# Patient Record
Sex: Female | Born: 1965
Health system: Southern US, Community
[De-identification: ages and names within clinical notes are randomized; demographics above are authoritative.]

## PROBLEM LIST (undated history)

## (undated) DIAGNOSIS — F32A Depression, unspecified: Secondary | ICD-10-CM

## (undated) DIAGNOSIS — K219 Gastro-esophageal reflux disease without esophagitis: Secondary | ICD-10-CM

## (undated) DIAGNOSIS — F329 Major depressive disorder, single episode, unspecified: Secondary | ICD-10-CM

## (undated) DIAGNOSIS — M1990 Unspecified osteoarthritis, unspecified site: Secondary | ICD-10-CM

## (undated) DIAGNOSIS — T7840XA Allergy, unspecified, initial encounter: Secondary | ICD-10-CM

## (undated) DIAGNOSIS — N390 Urinary tract infection, site not specified: Secondary | ICD-10-CM

## (undated) HISTORY — DX: Urinary tract infection, site not specified: N39.0

## (undated) HISTORY — DX: Depression, unspecified: F32.A

## (undated) HISTORY — DX: Allergy, unspecified, initial encounter: T78.40XA

## (undated) HISTORY — DX: Gastro-esophageal reflux disease without esophagitis: K21.9

## (undated) HISTORY — DX: Major depressive disorder, single episode, unspecified: F32.9

---

## 1991-12-28 HISTORY — PX: TUBAL LIGATION: SHX77

## 2006-05-09 ENCOUNTER — Ambulatory Visit: Payer: Self-pay

## 2007-06-06 ENCOUNTER — Ambulatory Visit: Payer: Self-pay

## 2008-06-20 ENCOUNTER — Ambulatory Visit: Payer: Self-pay

## 2009-04-11 ENCOUNTER — Ambulatory Visit: Payer: Self-pay | Admitting: Internal Medicine

## 2009-04-14 ENCOUNTER — Ambulatory Visit: Payer: Self-pay | Admitting: Internal Medicine

## 2009-08-20 ENCOUNTER — Ambulatory Visit: Payer: Self-pay | Admitting: Internal Medicine

## 2010-10-07 ENCOUNTER — Ambulatory Visit: Payer: Self-pay | Admitting: Internal Medicine

## 2011-01-22 LAB — HM PAP SMEAR: HM Pap smear: NORMAL

## 2011-11-15 ENCOUNTER — Ambulatory Visit: Payer: Self-pay | Admitting: Internal Medicine

## 2012-11-29 ENCOUNTER — Ambulatory Visit: Payer: Self-pay | Admitting: Internal Medicine

## 2012-11-30 ENCOUNTER — Telehealth: Payer: Self-pay | Admitting: Internal Medicine

## 2012-11-30 NOTE — Telephone Encounter (Signed)
Spoke to patient, she has appt already scheduled with you  on 01/22/13.

## 2012-11-30 NOTE — Telephone Encounter (Signed)
This patient has not been seen by me but her mammogram report was sent to me.  Please ask her to make appt for exam since there was an bnormality on left breast.

## 2012-12-19 ENCOUNTER — Encounter: Payer: Self-pay | Admitting: Internal Medicine

## 2013-01-03 ENCOUNTER — Encounter: Payer: Self-pay | Admitting: Internal Medicine

## 2013-01-22 ENCOUNTER — Ambulatory Visit (INDEPENDENT_AMBULATORY_CARE_PROVIDER_SITE_OTHER): Payer: 59 | Admitting: Internal Medicine

## 2013-01-22 ENCOUNTER — Encounter: Payer: Self-pay | Admitting: Internal Medicine

## 2013-01-22 VITALS — BP 110/78 | HR 67 | Temp 98.0°F | Resp 16 | Ht 66.75 in | Wt 153.5 lb

## 2013-01-22 DIAGNOSIS — F329 Major depressive disorder, single episode, unspecified: Secondary | ICD-10-CM

## 2013-01-22 DIAGNOSIS — F341 Dysthymic disorder: Secondary | ICD-10-CM

## 2013-01-22 DIAGNOSIS — F419 Anxiety disorder, unspecified: Secondary | ICD-10-CM

## 2013-01-22 DIAGNOSIS — R928 Other abnormal and inconclusive findings on diagnostic imaging of breast: Secondary | ICD-10-CM

## 2013-01-22 DIAGNOSIS — R635 Abnormal weight gain: Secondary | ICD-10-CM

## 2013-01-22 MED ORDER — CITALOPRAM HYDROBROMIDE 10 MG PO TABS: 10.0000 mg | ORAL_TABLET | Freq: Every day | ORAL | Status: DC

## 2013-01-22 MED ORDER — OMEPRAZOLE 10 MG PO CPDR: 20.0000 mg | DELAYED_RELEASE_CAPSULE | Freq: Every day | ORAL | Status: DC

## 2013-01-22 NOTE — Progress Notes (Signed)
Patient ID: Jessica Thornton, female   DOB: 16-Feb-1966, 48 y.o.   MRN: [id number]     Patient Active Problem List  Diagnosis  . Abnormal mammogram  . Anxiety and depression  . Weight gain    Subjective:  CC:   Chief Complaint  Patient presents with  . Establish Care    HPI:   Jessica Thornton is a 47 y.o. female who presents as a new patient to establish primary care with the chief complaint of 1) History of anxiety ,  previously taking citalopram but has been off of it for the past year or so.  She was last seen 2 yrs ago and feels she needs it again. Marland Kitchen  2) history of chest pain due to esophagitis managed with omeprazole which she takes correctly in the morning.  3) Abnormal right mammogam Dec 4th, felt a pop in her breast during the diagnostic ultrasound.  Dr. [place] has recommended to repeat due in March    3) Weight gain of 20 lbs .  NOt exercising,  Not following a specific diet.  No constipation, hair loss or heat/cold intolerance. She works as a Engineer, structural at [employer]  And is going to night school to get her Masters in Electronic Data Systems at [employer] in [place].  Two nights per week .     Past Medical History  Diagnosis Date  . Depression   . GERD (gastroesophageal reflux disease)   . Allergy   . UTI (urinary tract infection)     Past Surgical History  Procedure Date  . Tubal ligation 1993    Family History  Problem Relation Age of Onset  . Alcohol abuse Father   . Arthritis Father   . Hyperlipidemia Father   . Stroke Father   . Hypertension Father   . Hyperlipidemia Maternal Grandfather   . Hypertension Maternal Grandfather   . Diabetes Maternal Grandfather   . Arthritis Paternal Grandmother   . Cancer Maternal Aunt     pancreatic ca    History   Social History  . Marital Status: Married    Spouse Name: N/A    Number of Children: N/A  . Years of Education: N/A   Occupational History  . Not on file.    Social History Main Topics  . Smoking status: Former Smoker    Quit date: 01/22/1993  . Smokeless tobacco: Not on file  . Alcohol Use: 2.4 oz/week    4 Glasses of wine per week  . Drug Use: No  . Sexually Active: Yes    Birth Control/ Protection: Surgical     Comment: tubal ligation    Other Topics Concern  . Not on file   Social History Narrative  . No narrative on file         @ALLHX @    Review of Systems:   The remainder of the review of systems was negative except those addressed in the HPI.       Objective:  BP 110/78  Pulse 67  Temp 98 F (36.7 C) (Oral)  Resp 16  Ht 5' 6.75" (1.695 m)  Wt 153 lb 8 oz (69.627 kg)  BMI 24.22 kg/m2  SpO2 98%  LMP 01/17/2013  General appearance: alert, cooperative and appears stated age Ears: normal TM's and external ear canals both ears Throat: lips, mucosa, and tongue normal; teeth and gums normal Neck: no adenopathy, no carotid bruit, supple, symmetrical, trachea midline and thyroid not enlarged, symmetric, no  tenderness/mass/nodules Back: symmetric, no curvature. ROM normal. No CVA tenderness. Lungs: clear to auscultation bilaterally Heart: regular rate and rhythm, S1, S2 normal, no murmur, click, rub or gallop Abdomen: soft, non-tender; bowel sounds normal; no masses,  no organomegaly Pulses: 2+ and symmetric Skin: Skin color, texture, turgor normal. No rashes or lesions Lymph nodes: Cervical, supraclavicular, and axillary nodes normal.  Assessment and Plan:  Abnormal mammogram Repeat diagnostic has been ordered for march . No palpable breast lesions currently.   Anxiety and depression Life and professional stressors noted,,  Advised to start exercisign for stress relief and resume citalopram at 10 mg daily ,.  Weight gain I have addressed  BMI and recommended a low glycemic index diet utilizing smaller more frequent meals to increase metabolism.  I have also recommended that patient start exercising  with a goal of 30 minutes of aerobic exercise a minimum of 5 days per week. Screening for lipid disorders, thyroid and diabetes to be done today.     Updated Medication List Outpatient Encounter Prescriptions as of 01/22/2013  Medication Sig Dispense Refill  . CALCIUM-MAGNESIUM-ZINC PO Take 1 capsule by mouth daily.      . Multiple Vitamin (MULTIVITAMIN) tablet Take 1 tablet by mouth daily.      Marland Kitchen omeprazole (PRILOSEC) 10 MG capsule Take 2 capsules (20 mg total) by mouth daily.  30 capsule  6  . [DISCONTINUED] omeprazole (PRILOSEC) 10 MG capsule Take 10 mg by mouth daily.      . citalopram (CELEXA) 10 MG tablet Take 1 tablet (10 mg total) by mouth daily.  30 tablet  3     Orders Placed This Encounter  Procedures  . MM Digital Diagnostic Unilat R  . HM PAP SMEAR    No Follow-up on file.

## 2013-01-22 NOTE — Patient Instructions (Addendum)
You can lose 10%  Of your current body weight over the next 3 months   Return at your leisure for fasting labs   This is  my version of a  "Low GI"  Diet:  It is not ultra low carb, but will still lower your blood sugars and allow you to lose 5 to 10 lbs per month if you follow it carefully. All of the foods can be found at grocery stores and in bulk at [employer].  The Atkins protein bars and shakes are available in more varieties at Target, [employer] and [employer].     7 AM Breakfast:  Low carbohydrate Protein  Shakes (I recommend the EAS AdvantEdge "Carb Control" shakes  Or the low carb shakes by Atkins.   Both are available everywhere:  In  cases at [employer]Js  Or in 4 packs at grocery stores and pharmacies  2.5 carbs  (Alternative is  a toasted Arnold's Sandwhich Thin w/ peanut butter, a "Bagel Thin" with cream cheese and salmon) or  a scrambled egg burrito made with a low carb tortilla .  Avoid cereal and bananas, oatmeal too unless you are cooking the old fashioned kind that takes 30-40 minutes to prepare.  the rest is overly processed, has minimal fiber, and is loaded with carbohydrates!   10 AM: Protein bar by Atkins (the snack size, under 200 cal).  There are many varieties , available widely again or in bulk in limited varieties at BJs)  Other so called "protein bars" tend to be loaded with carbohydrates.  Remember, in food advertising, the word "energy" is synonymous for " carbohydrate."  Lunch: sandwich of [place], (or any lunchmeat, grilled meat or canned tuna), fresh avocado, mayonnaise  and cheese on a lower carbohydrate pita bread, flatbread, or tortilla . Ok to use regular mayonnaise. The bread is the only source or carbohydrate that can be decreased (Joseph's makes a pita bread and a flat bread that are 50 cal and 4 net carbs ; Toufayan makes a low carb flatbread that's 100 cal and 9 net carbs  and  Mission makes a low carb whole wheat tortilla  That is 210 cal and 6 net carbs)  3 PM:  Mid  day :  Another protein bar,  Or a  cheese stick (100 cal, 0 carbs),  Or 1 ounce of  almonds, walnuts, pistachios, pecans, peanuts,  Macadamia nuts. Or a Dannon light n Fit greek yogurt, 80 cal 8 net carbs . Avoid "granola"; the dried cranberries and raisins are loaded with carbohydrates. Mixed nuts ok if no raisins or cranberries or dried fruit.      6 PM  Dinner:  "mean and green:"  Meat/chicken/fish or a high protein legume; , with a green salad, and a low GI  Veggie (broccoli, cauliflower, green beans, spinach, brussel sprouts. Lima beans) : Avoid "Low fat dressings, as well as Reyne Dumas and [address]! They are loaded with sugar! Instead use ranch, vinagrette,  Blue cheese, etc.  There is a low carb pasta by Dreamfield's available at [facility] that is acceptable and tastes great. Try Michel Angel's chicken piccata over low carb pasta. The chicken dish is 0 carbs, and can be found in frozen section at BJs and Lowe's. Also try [employer]" (pulled pork, no sauce,  0 carbs) and his pot roast.   both are in the refrigerated section at BJs   Dreamfield's makes a low carb pasta only 5 g/serving.  Available at  all grocery stores,  And tastes like normal pasta  9 PM snack : Breyer's "low carb" fudgsicle or  ice cream bar (Carb Smart line), or  Weight Watcher's ice cream bar , or another "no sugar added" ice cream;a serving of fresh berries/cherries with whipped cream (Avoid bananas, pineapple, grapes  and watermelon on a regular basis because they are high in sugar)   Remember that snack Substitutions should be less than 10 carbs per serving and meals < 20 carbs. Remember to subtract fiber grams and sugar alcohols to get the "net carbs."

## 2013-01-23 DIAGNOSIS — F419 Anxiety disorder, unspecified: Secondary | ICD-10-CM | POA: Insufficient documentation

## 2013-01-23 DIAGNOSIS — R635 Abnormal weight gain: Secondary | ICD-10-CM | POA: Insufficient documentation

## 2013-01-23 DIAGNOSIS — R928 Other abnormal and inconclusive findings on diagnostic imaging of breast: Secondary | ICD-10-CM | POA: Insufficient documentation

## 2013-01-23 NOTE — Assessment & Plan Note (Signed)
Life and professional stressors noted,,  Advised to start exercisign for stress relief and resume citalopram at 10 mg daily ,.

## 2013-01-23 NOTE — Assessment & Plan Note (Addendum)
Repeat diagnostic has been ordered for march . No palpable breast lesions currently.

## 2013-01-23 NOTE — Assessment & Plan Note (Signed)
I have addressed  BMI and recommended a low glycemic index diet utilizing smaller more frequent meals to increase metabolism.  I have also recommended that patient start exercising with a goal of 30 minutes of aerobic exercise a minimum of 5 days per week. Screening for lipid disorders, thyroid and diabetes to be done today.   

## 2013-02-01 ENCOUNTER — Other Ambulatory Visit: Payer: 59

## 2013-02-21 ENCOUNTER — Other Ambulatory Visit: Payer: 59

## 2013-03-14 ENCOUNTER — Telehealth: Payer: Self-pay | Admitting: *Deleted

## 2013-03-14 DIAGNOSIS — Z13220 Encounter for screening for lipoid disorders: Secondary | ICD-10-CM

## 2013-03-14 DIAGNOSIS — R5381 Other malaise: Secondary | ICD-10-CM

## 2013-03-14 NOTE — Telephone Encounter (Signed)
Pt is coming in for labs tomorrow 03.20.2014 what labs and dx would you like? Thank you

## 2013-03-15 ENCOUNTER — Other Ambulatory Visit (INDEPENDENT_AMBULATORY_CARE_PROVIDER_SITE_OTHER): Payer: 59

## 2013-03-15 ENCOUNTER — Other Ambulatory Visit: Payer: Self-pay | Admitting: *Deleted

## 2013-03-15 DIAGNOSIS — R5381 Other malaise: Secondary | ICD-10-CM

## 2013-03-15 DIAGNOSIS — R5383 Other fatigue: Secondary | ICD-10-CM

## 2013-03-15 DIAGNOSIS — Z13220 Encounter for screening for lipoid disorders: Secondary | ICD-10-CM

## 2013-03-15 LAB — LIPID PANEL
Cholesterol: 181 mg/dL (ref 0–200)
HDL: 44.9 mg/dL (ref >39.00)
LDL Cholesterol: 119 mg/dL — ABNORMAL HIGH (ref 0–99)
Total CHOL/HDL Ratio: 4
Triglycerides: 87 mg/dL (ref 0.0–149.0)
VLDL: 17.4 mg/dL (ref 0.0–40.0)

## 2013-03-15 LAB — COMPREHENSIVE METABOLIC PANEL
ALT: 18 U/L (ref 0–35)
AST: 18 U/L (ref 0–37)
Albumin: 4.4 g/dL (ref 3.5–5.2)
Alkaline Phosphatase: 52 U/L (ref 39–117)
BUN: 9 mg/dL (ref 6–23)
CO2: 26 mEq/L (ref 19–32)
Calcium: 8.8 mg/dL (ref 8.4–10.5)
Chloride: 103 mEq/L (ref 96–112)
Creatinine, Ser: 0.6 mg/dL (ref 0.4–1.2)
GFR: 111.61 mL/min (ref >60.00)
Glucose, Bld: 90 mg/dL (ref 70–99)
Potassium: 4.2 mEq/L (ref 3.5–5.1)
Sodium: 136 mEq/L (ref 135–145)
Total Bilirubin: 1 mg/dL (ref 0.3–1.2)
Total Protein: 7.8 g/dL (ref 6.0–8.3)

## 2013-03-15 LAB — CBC WITH DIFFERENTIAL/PLATELET
Basophils Absolute: 0 10*3/uL (ref 0.0–0.1)
Basophils Relative: 0.4 % (ref 0.0–3.0)
Eosinophils Absolute: 0.2 10*3/uL (ref 0.0–0.7)
Eosinophils Relative: 2 % (ref 0.0–5.0)
HCT: 40.1 % (ref 36.0–46.0)
Hemoglobin: 13.4 g/dL (ref 12.0–15.0)
Lymphocytes Relative: 23.7 % (ref 12.0–46.0)
Lymphs Abs: 2 10*3/uL (ref 0.7–4.0)
MCHC: 33.3 g/dL (ref 30.0–36.0)
MCV: 98 fl (ref 78.0–100.0)
Monocytes Absolute: 0.8 10*3/uL (ref 0.1–1.0)
Monocytes Relative: 9.5 % (ref 3.0–12.0)
Neutro Abs: 5.5 10*3/uL (ref 1.4–7.7)
Neutrophils Relative %: 64.4 % (ref 43.0–77.0)
Platelets: 225 10*3/uL (ref 150.0–400.0)
RBC: 4.09 Mil/uL (ref 3.87–5.11)
RDW: 13.2 % (ref 11.5–14.6)
WBC: 8.6 10*3/uL (ref 4.5–10.5)

## 2013-03-15 LAB — TSH: TSH: 0.71 u[IU]/mL (ref 0.35–5.50)

## 2013-03-16 ENCOUNTER — Encounter: Payer: Self-pay | Admitting: General Practice

## 2013-04-18 LAB — HM PAP SMEAR: HM Pap smear: NORMAL

## 2013-04-24 ENCOUNTER — Encounter: Payer: Self-pay | Admitting: Internal Medicine

## 2013-04-24 ENCOUNTER — Other Ambulatory Visit (HOSPITAL_COMMUNITY)
Admission: RE | Admit: 2013-04-24 | Discharge: 2013-04-24 | Disposition: A | Discharge status: Home / Self Care | Payer: 59 | Source: Ambulatory Visit | Attending: Internal Medicine | Admitting: Internal Medicine

## 2013-04-24 ENCOUNTER — Ambulatory Visit (INDEPENDENT_AMBULATORY_CARE_PROVIDER_SITE_OTHER): Payer: 59 | Admitting: Internal Medicine

## 2013-04-24 VITALS — BP 118/78 | HR 90 | Temp 98.1°F | Resp 16 | Ht 67.0 in | Wt 144.8 lb

## 2013-04-24 DIAGNOSIS — R928 Other abnormal and inconclusive findings on diagnostic imaging of breast: Secondary | ICD-10-CM

## 2013-04-24 DIAGNOSIS — F341 Dysthymic disorder: Secondary | ICD-10-CM

## 2013-04-24 DIAGNOSIS — F419 Anxiety disorder, unspecified: Secondary | ICD-10-CM

## 2013-04-24 DIAGNOSIS — Z0000 Encounter for general adult medical examination without abnormal findings: Secondary | ICD-10-CM

## 2013-04-24 DIAGNOSIS — Z124 Encounter for screening for malignant neoplasm of cervix: Secondary | ICD-10-CM

## 2013-04-24 DIAGNOSIS — Z01419 Encounter for gynecological examination (general) (routine) without abnormal findings: Secondary | ICD-10-CM | POA: Insufficient documentation

## 2013-04-24 DIAGNOSIS — Z1151 Encounter for screening for human papillomavirus (HPV): Secondary | ICD-10-CM | POA: Insufficient documentation

## 2013-04-24 MED ORDER — OMEPRAZOLE 20 MG PO CPDR: 20.0000 mg | DELAYED_RELEASE_CAPSULE | Freq: Every day | ORAL | Status: DC

## 2013-04-24 NOTE — Assessment & Plan Note (Signed)
She had additional mammograms sone in march which were normal

## 2013-04-24 NOTE — Assessment & Plan Note (Signed)
Annual comprehensive exam was done including breast, pelvic and PAP smear. All screenings have been addressed .  

## 2013-04-24 NOTE — Assessment & Plan Note (Signed)
Managed with citalopram . No changes today  

## 2013-04-24 NOTE — Progress Notes (Signed)
Patient ID: Jessica Thornton, female   DOB: [birth year], 47 y.o.   MRN: [id number]  Subjective:     Jessica Thornton is a 47 y.o. female and is here for a comprehensive physical exam. The patient reports no problems.  History   Social History  . Marital Status: Married    Spouse Name: N/A    Number of Children: N/A  . Years of Education: N/A   Occupational History  . Not on file.   Social History Main Topics  . Smoking status: Former Smoker    Quit date: 01/22/1993  . Smokeless tobacco: Not on file  . Alcohol Use: 2.4 oz/week    4 Glasses of wine per week  . Drug Use: No  . Sexually Active: Yes    Birth Control/ Protection: Surgical     Comment: tubal ligation    Other Topics Concern  . Not on file   Social History Narrative  . No narrative on file   Health Maintenance  Topic Date Due  . Tetanus/tdap  12/04/1984  . Influenza Vaccine  08/27/2013  . Pap Smear  01/22/2014    The following portions of the patient's history were reviewed and updated as appropriate: allergies, current medications, past family history, past medical history, past social history, past surgical history and problem list.  Review of Systems A comprehensive review of systems was negative.   Objective:   BP 118/78  Pulse 90  Temp(Src) 98.1 F (36.7 C) (Oral)  Resp 16  Ht 5\' 7"  (1.702 m)  Wt 144 lb 12 oz (65.658 kg)  BMI 22.67 kg/m2  SpO2 99%  LMP 04/20/2013   General Appearance:    Alert, cooperative, no distress, appears stated age  Head:    Normocephalic, without obvious abnormality, atraumatic  Eyes:    PERRL, conjunctiva/corneas clear, EOM's intact, fundi    benign, both eyes  Ears:    Normal TM's and external ear canals, both ears  Nose:   Nares normal, septum midline, mucosa normal, no drainage    or sinus tenderness  Throat:   Lips, mucosa, and tongue normal; teeth and gums normal  Neck:   Supple, symmetrical, trachea midline, no adenopathy;    thyroid:  no  enlargement/tenderness/nodules; no carotid   bruit or JVD  Back:     Symmetric, no curvature, ROM normal, no CVA tenderness  Lungs:     Clear to auscultation bilaterally, respirations unlabored  Chest Wall:    No tenderness or deformity   Heart:    Regular rate and rhythm, S1 and S2 normal, no murmur, rub   or gallop  Breast Exam:    No tenderness, masses, or nipple abnormality  Abdomen:     Soft, non-tender, bowel sounds active all four quadrants,    no masses, no organomegaly  Genitalia:    Pelvic: cervix normal in appearance, external genitalia normal, no adnexal masses or tenderness, no cervical motion tenderness, rectovaginal septum normal, uterus normal size, shape, and consistency and vagina normal without discharge  Extremities:   Extremities normal, atraumatic, no cyanosis or edema  Pulses:   2+ and symmetric all extremities  Skin:   Skin color, texture, turgor normal, no rashes or lesions  Lymph nodes:   Cervical, supraclavicular, and axillary nodes normal  Neurologic:   CNII-XII intact, normal strength, sensation and reflexes    throughout    .    Assessment:   Abnormal mammogram She had additional mammograms sone in march which were normal  Routine general  medical examination at a health care facility Annual comprehensive exam was done including breast, pelvic and PAP smear. All screenings have been addressed .   Anxiety and depression Managed with citalopram.  No changes today.   Updated Medication List Outpatient Encounter Prescriptions as of 04/24/2013  Medication Sig Dispense Refill  . CALCIUM-MAGNESIUM-ZINC PO Take 1 capsule by mouth daily.      . citalopram (CELEXA) 10 MG tablet Take 1 tablet (10 mg total) by mouth daily.  30 tablet  3  . Multiple Vitamin (MULTIVITAMIN) tablet Take 1 tablet by mouth daily.      Marland Kitchen omeprazole (PRILOSEC) 20 MG capsule Take 1 capsule (20 mg total) by mouth daily.  90 capsule  3  . [DISCONTINUED] omeprazole (PRILOSEC) 10 MG capsule  Take 2 capsules (20 mg total) by mouth daily.  30 capsule  6   No facility-administered encounter medications on file as of 04/24/2013.

## 2013-05-31 ENCOUNTER — Ambulatory Visit: Payer: Self-pay | Admitting: Internal Medicine

## 2013-07-13 ENCOUNTER — Encounter: Payer: Self-pay | Admitting: Internal Medicine

## 2013-10-12 ENCOUNTER — Other Ambulatory Visit: Payer: Self-pay | Admitting: Internal Medicine

## 2013-10-12 MED ORDER — CITALOPRAM HYDROBROMIDE 10 MG PO TABS: 10.0000 mg | ORAL_TABLET | Freq: Every day | ORAL | Status: DC

## 2013-10-12 NOTE — Telephone Encounter (Signed)
Rx sent 

## 2013-10-12 NOTE — Telephone Encounter (Signed)
citalopram (CELEXA) 10 MG tablet  #90

## 2014-01-03 ENCOUNTER — Other Ambulatory Visit: Payer: Self-pay | Admitting: Internal Medicine

## 2014-02-06 ENCOUNTER — Ambulatory Visit: Payer: Self-pay | Admitting: Internal Medicine

## 2014-02-19 LAB — HM MAMMOGRAPHY

## 2014-03-12 ENCOUNTER — Encounter: Payer: Self-pay | Admitting: Internal Medicine

## 2014-04-08 ENCOUNTER — Encounter: Payer: Self-pay | Admitting: Internal Medicine

## 2014-04-08 ENCOUNTER — Ambulatory Visit (INDEPENDENT_AMBULATORY_CARE_PROVIDER_SITE_OTHER): Payer: 59 | Admitting: Internal Medicine

## 2014-04-08 VITALS — BP 128/76 | HR 61 | Temp 98.5°F | Resp 16 | Wt 152.2 lb

## 2014-04-08 DIAGNOSIS — F341 Dysthymic disorder: Secondary | ICD-10-CM

## 2014-04-08 DIAGNOSIS — M7040 Prepatellar bursitis, unspecified knee: Secondary | ICD-10-CM

## 2014-04-08 DIAGNOSIS — F329 Major depressive disorder, single episode, unspecified: Secondary | ICD-10-CM

## 2014-04-08 DIAGNOSIS — R358 Other polyuria: Secondary | ICD-10-CM

## 2014-04-08 DIAGNOSIS — F419 Anxiety disorder, unspecified: Secondary | ICD-10-CM

## 2014-04-08 DIAGNOSIS — K9049 Malabsorption due to intolerance, not elsewhere classified: Secondary | ICD-10-CM

## 2014-04-08 DIAGNOSIS — K9089 Other intestinal malabsorption: Secondary | ICD-10-CM

## 2014-04-08 DIAGNOSIS — M7041 Prepatellar bursitis, right knee: Secondary | ICD-10-CM

## 2014-04-08 DIAGNOSIS — N3090 Cystitis, unspecified without hematuria: Secondary | ICD-10-CM

## 2014-04-08 DIAGNOSIS — F32A Depression, unspecified: Secondary | ICD-10-CM

## 2014-04-08 DIAGNOSIS — R3589 Other polyuria: Secondary | ICD-10-CM

## 2014-04-08 DIAGNOSIS — R635 Abnormal weight gain: Secondary | ICD-10-CM

## 2014-04-08 LAB — POCT URINALYSIS DIPSTICK
Bilirubin, UA: NEGATIVE
Blood, UA: NEGATIVE
Glucose, UA: NEGATIVE
Ketones, UA: NEGATIVE
Leukocytes, UA: NEGATIVE
Nitrite, UA: NEGATIVE
Protein, UA: NEGATIVE
Spec Grav, UA: 1.01
Urobilinogen, UA: 0.2
pH, UA: 7

## 2014-04-08 MED ORDER — ALPRAZOLAM 0.5 MG PO TABS: 0.5000 mg | ORAL_TABLET | Freq: Every evening | ORAL | Status: DC | PRN

## 2014-04-08 MED ORDER — MELOXICAM 15 MG PO TABS: 15.0000 mg | ORAL_TABLET | Freq: Every day | ORAL | Status: DC

## 2014-04-08 NOTE — Progress Notes (Signed)
Pre-visit discussion using our clinic review tool. No additional management support is needed unless otherwise documented below in the visit note.  

## 2014-04-08 NOTE — Progress Notes (Signed)
Patient ID: Jessica Thornton, female   DOB: 01/30/1966, 48 y.o.   MRN: [id number]  Patient Active Problem List   Diagnosis Date Noted  . 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 Prepatellar bursitis of right knee 04/08/2014  . Cystitis 04/08/2014  . Routine general medical examination at a health care facility 04/24/2013  . Abnormal mammogram 01/23/2013  . Anxiety and depression 01/23/2013  . Weight gain 01/23/2013    Subjective:  CC:   Chief Complaint  Patient presents with  . Urinary Tract Infection    [facility] friday diagnosed  . Knee Pain    bilateral  problems with gas on stomach last 2 weeks    HPI:   Jessica Thornton is a 48 y.o. female who presents for Follow up on several issues.  Last seen April 2014   10 days ago developed dysuria after pulling out a dry tampon .  Had some vaginal discomfort afterward For a weekend but then developed migratory abdominal  pain  Which would alternate between left and right sides accompanied by fatigue so after a week she went to [facility]  And was treated  for pyuria with ciprofloxacin.    Symptoms have resolved as of  2 days ago.  Attributes the abd pain to  gas since she had changed to high fiber diet, and had been having a lot of burping.  No indigestion or constipation or diarrhea.    2) Knee pain bilaterally accompendied by recent effusion on the right,  Symptoms of pain  aggravated by walking for exercise, progressed to the point that she had pain at work to walk or stand .  Couldn't kneel on either due to fluid .  Had unofficial u/s of right knee,  Effusion seen.    Past Medical History  Diagnosis Date  . Depression   . GERD (gastroesophageal reflux disease)   . Allergy   . UTI (urinary tract infection)     Past Surgical History  Procedure Laterality Date  . Tubal ligation  1993       The following portions of the patient's history were reviewed and updated as appropriate: Allergies,  current medications, and problem list.    Review of Systems:   Patient denies headache, fevers, malaise, unintentional weight loss, skin rash, eye pain, sinus congestion and sinus pain, sore throat, dysphagia,  hemoptysis , cough, dyspnea, wheezing, chest pain, palpitations, orthopnea, edema, abdominal pain, nausea, melena, diarrhea, constipation, flank pain, dysuria, hematuria, urinary  Frequency, nocturia, numbness, tingling, seizures,  Focal weakness, Loss of consciousness,  Tremor, insomnia, depression, anxiety, and suicidal ideation.     History   Social History  . Marital Status: Married    Spouse Name: N/A    Number of Children: N/A  . Years of Education: N/A   Occupational History  . Not on file.   Social History Main Topics  . Smoking status: Former Smoker    Quit date: 01/22/1993  . Smokeless tobacco: Not on file  . Alcohol Use: 2.4 oz/week    4 Glasses of wine per week  . Drug Use: No  . Sexual Activity: Yes    Birth Control/ Protection: Surgical     Comment: tubal ligation    Other Topics Concern  . Not on file   Social History Narrative  . No narrative on file    Objective:  Filed Vitals:   04/08/14 1336  BP: 128/76  Pulse: 61  Temp: 98.5 F (36.9 C)  Resp:  16     General appearance: alert, cooperative and appears stated age Ears: normal TM's and external ear canals both ears Throat: lips, mucosa, and tongue normal; teeth and gums normal Neck: no adenopathy, no carotid bruit, supple, symmetrical, trachea midline and thyroid not enlarged, symmetric, no tenderness/mass/nodules Back: symmetric, no curvature. ROM normal. No CVA tenderness. Lungs: clear to auscultation bilaterally Heart: regular rate and rhythm, S1, S2 normal, no murmur, click, rub or gallop Abdomen: soft, non-tender; bowel sounds normal; no masses,  no organomegaly Pulses: 2+ and symmetric Skin: Skin color, texture, turgor normal. No rashes or lesions Lymph nodes: Cervical,  supraclavicular, and axillary nodes normal. MSK: creiptus bilaterally, r > L.  No effusion   Assessment and Plan:  Anxiety and depression Managed with celexa 10 mg daily.  Took it for 3 months before tapering it off  6 weeks ago .  Wants to manage her anxiety without daily medication .  Prn alprazolam. Risk and benefits of medication were discussed with patient today  Prepatellar bursitis of right knee With crepitus noted on exam,  Effusion currently resolved but was confirmed with unofficial ultrasound done a month ago at work.  Recommended daily meloxicam, ice,  And quad/hamstring strengthening  exercises.  Referral to [facility] if no improvement   Cystitis Resolved by repeat UA, with empiric ciprofloxacin   Weight gain No signs of thyroid disorder,  BMI is normal  I have recommended  low glycemic index diet and regular exercise a minimum of 5 days per week.    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    A total of 40 minutes was spent with patient more than half of which was spent in counseling, reviewing records from other prviders and coordination of care.  Updated Medication List Outpatient Encounter Prescriptions as of 04/08/2014  Medication Sig  . CALCIUM-MAGNESIUM-ZINC PO Take 1 capsule by mouth daily.  . ciprofloxacin (CIPRO) 500 MG tablet Take 500 mg by mouth 2 (two) times daily.  . Multiple Vitamin (MULTIVITAMIN) tablet Take 1 tablet by mouth daily.  Marland Kitchen omeprazole (PRILOSEC) 20 MG capsule Take 1 capsule (20 mg total) by mouth daily.  Marland Kitchen ALPRAZolam (XANAX) 0.5 MG tablet Take 1 tablet (0.5 mg total) by mouth at bedtime as needed for anxiety.  . citalopram (CELEXA) 10 MG tablet Take 1 tablet (10 mg total) by mouth daily.  . meloxicam (MOBIC) 15 MG tablet Take 1 tablet (15 mg total) by mouth daily.     Orders Placed This Encounter  Procedures  . POCT Urinalysis Dipstick    Return in about 3 months (around  07/08/2014).

## 2014-04-08 NOTE — Patient Instructions (Signed)
I have prescribed alprazolam to use AS NEEDED for extreme anxiety instead of daily citalopram  Prepatellar Bursitis with Rehab  Bursitis is a condition that is characterized by inflammation of a bursa. Saunders Revel exists in many areas of the body. They are fluid filled sacs that lie between a soft tissue (skin, tendon, or ligament) and a bone, and they reduce friction between the structures as well as the stress placed on the soft tissue. Prepatellar bursitis is inflammation of the bursa that lies between the skin and the kneecap (patella). This condition often causes pain over the patella. SYMPTOMS   Pain, tenderness, and/or inflammation over the patella.  Pain that worsens with movement of the knee joint.  Decreased range of motion for the knee joint.  A crackling sound (crepitation) when the bursa is moved or touched.  Occasionally, painless swelling of the bursa.  Fever (when infected). CAUSES  Bursitis is caused by damage to the bursa, which results in an inflammatory response. Common mechanisms of injury include:  Direct trauma to the front of the knee.  Repetitive and/ or stressful use of the knee. RISK INCREASES WITH:  Activities in which kneeling and/or falling on one's knees is likely (volleyball or football).  Repetitive and stressful training, especially if it involves running on hills.  Improper training techniques, such as a sudden increase in the intensity, frequency or duration of training.  Failure to warm-up properly before activity.  Poor technique.  Artificial turf. PREVENTION   Avoid kneeling or falling on your knees.  Warm up and stretch properly before activity.  Allow for adequate recovery between workouts.  Maintain physical fitness:  Strength, flexibility, and endurance.  Cardiovascular fitness.  Learn and use proper technique. When possible, a have coach correct improper technique.  Wear properly fitted and padded protective equipment (knee  pads). PROGNOSIS  If treated properly, then the symptoms of prepatellar bursitis usually resolve within 2 weeks. RELATED COMPLICATIONS   Recurrent symptoms that result in a chronic problem.  Prolonged healing time, if improperly treated or re-injured.  Limited range of motion.  Infection of bursa.  Chronic inflammation or scarring of bursa. TREATMENT  Treatment initially involves the use of ice and medication to help reduce pain and inflammation. The use of strengthening and stretching exercises may help reduce pain with activity, especially those of the quadriceps and hamstring muscles. These exercises may be performed at home or with referral to a therapist. Your caregiver may recommend kneepads when you return to playing sports, in order to reduce the stress on the prepatellar bursa. If symptoms persist despite treatment, then your caregiver may drain fluid out with a needle (aspirate) the bursa. If symptoms persist for greater than 6 months despite non-surgical (conservative) treatment, then surgery may be recommended to remove the bursa.  MEDICATION  If pain medication is necessary, then nonsteroidal anti-inflammatory medications, such as aspirin and ibuprofen, or other minor pain relievers, such as acetaminophen, are often recommended.  Do not take pain medication for 7 days before surgery.  Prescription pain relievers may be given if deemed necessary by your caregiver. Use only as directed and only as much as you need.  Corticosteroid injections may be given by your caregiver. These injections should be reserved for the most serious cases, because they may only be given a certain number of times. HEAT AND COLD  Cold treatment (icing) relieves pain and reduces inflammation. Cold treatment should be applied for 10 to 15 minutes every 2 to 3 hours for inflammation and  pain and immediately after any activity that aggravates your symptoms. Use ice packs or massage the area with a piece  of ice (ice massage).  Heat treatment may be used prior to performing the stretching and strengthening activities prescribed by your caregiver, physical therapist, or athletic trainer. Use a heat pack or soak the injury in warm water. SEEK MEDICAL CARE IF:  Treatment seems to offer no benefit, or the condition worsens.  Any medications produce adverse side effects. EXERCISES RANGE OF MOTION (ROM) AND STRETCHING EXERCISES - Prepatellar Bursitis These exercises may help you when beginning to rehabilitate your injury. Your symptoms may resolve with or without further involvement from your physician, physical therapist or athletic trainer. While completing these exercises, remember:   Restoring tissue flexibility helps normal motion to return to the joints. This allows healthier, less painful movement and activity.  An effective stretch should be held for at least 30 seconds.  A stretch should never be painful. You should only feel a gentle lengthening or release in the stretched tissue. STRETCH - Hamstrings, Standing  Stand or sit and extend your right / left leg, placing your foot on a chair or foot stool  Keeping a slight arch in your low back and your hips straight forward.  Lead with your chest and lean forward at the waist until you feel a gentle stretch in the back of your right / left knee or thigh. (When done correctly, this exercise requires leaning only a small distance.)  Hold this position for __________ seconds. Repeat __________ times. Complete this stretch __________ times per day. STRETCH - Quadriceps, Prone   Lie on your stomach on a firm surface, such as a bed or padded floor.  Bend your right / left knee and grasp your ankle. If you are unable to reach, your ankle or pant leg, use a belt around your foot to lengthen your reach.  Gently pull your heel toward your buttocks. Your knee should not slide out to the side. You should feel a stretch in the front of your thigh  and/or knee.  Hold this position for __________ seconds. Repeat __________ times. Complete this stretch __________ times per day.  STRETCH - Hamstrings/Adductors, V-Sit   Sit on the floor with your legs extended in a large "V," keeping your knees straight.  With your head and chest upright, bend at your waist reaching for your right foot to stretch your left adductors.  You should feel a stretch in your left inner thigh. Hold for __________ seconds.  Return to the upright position to relax your leg muscles.  Continuing to keep your chest upright, bend straight forward at your waist to stretch your hamstrings.  You should feel a stretch behind both of your thighs and/or knees. Hold for __________ seconds.  Return to the upright position to relax your leg muscles.  Repeat steps 2 through 4. Repeat __________ times. Complete this exercise __________ times per day.  STRENGTHENING EXERCISES - Prepatellar Bursitis  These exercises may help you when beginning to rehabilitate your injury. They may resolve your symptoms with or without further involvement from your physician, physical therapist or athletic trainer. While completing these exercises, remember:  Muscles can gain both the endurance and the strength needed for everyday activities through controlled exercises.  Complete these exercises as instructed by your physician, physical therapist or athletic trainer. Progress the resistance and repetitions only as guided. STRENGTH - Quadriceps, Isometrics  Lie on your back with your right / left leg extended  and your opposite knee bent.  Gradually tense the muscles in the front of your right / left thigh. You should see either your kneecap slide up toward your hip or increased dimpling just above the knee. This motion will push the back of the knee down toward the floor/mat/bed on which you are lying.  Hold the muscle as tight as you can without increasing your pain for __________  seconds.  Relax the muscles slowly and completely in between each repetition. Repeat __________ times. Complete this exercise __________ times per day.  STRENGTH - Quadriceps, Short Arcs   Lie on your back. Place a __________ inch towel roll under your knee so that the knee slightly bends.  Raise only your lower leg by tightening the muscles in the front of your thigh. Do not allow your thigh to rise.  Hold this position for __________ seconds. Repeat __________ times. Complete this exercise __________ times per day.  OPTIONAL ANKLE WEIGHTS: Begin with ____________________, but DO NOT exceed ____________________. Increase in1 lb/0.5 kg increments.  STRENGTH - Quadriceps, Straight Leg Raises  Quality counts! Watch for signs that the quadriceps muscle is working to insure you are strengthening the correct muscles and not "cheating" by substituting with healthier muscles.  Lay on your back with your right / left leg extended and your opposite knee bent.  Tense the muscles in the front of your right / left thigh. You should see either your kneecap slide up or increased dimpling just above the knee. Your thigh may even quiver.  Tighten these muscles even more and raise your leg 4 to 6 inches off the floor. Hold for __________ seconds.  Keeping these muscles tense, lower your leg.  Relax the muscles slowly and completely in between each repetition. Repeat __________ times. Complete this exercise __________ times per day.  STRENGTH - Quadriceps, Step-Ups   Use a thick book, step or step stool that is __________ inches tall.  Holding a wall or counter for balance only, not support.  Slowly step-up with your right / left foot, keeping your knee in line with your hip and foot. Do not allow your knee to bend so far that you cannot see your toes.  Slowly unlock your knee and lower yourself to the starting position. Your muscles, not gravity, should lower you. Repeat __________ times. Complete  this exercise __________ times per day. Document Released: 12/13/2005 Document Revised: 03/06/2012 Document Reviewed: 03/27/2009 [facility] Patient Information 2014 [place], [place].

## 2014-04-08 NOTE — Assessment & Plan Note (Addendum)
With crepitus noted on exam,  Effusion currently resolved but was confirmed with unofficial ultrasound done a month ago at work.  Recommended daily meloxicam, ice,  And quad/hamstring strengthening  exercises.  Referral to [facility] if no improvement

## 2014-04-08 NOTE — Assessment & Plan Note (Addendum)
Resolved by repeat UA, with empiric ciprofloxacin

## 2014-04-08 NOTE — Assessment & Plan Note (Addendum)
Managed with celexa 10 mg daily.  Took it for 3 months before tapering it off  6 weeks ago .  Wants to manage her anxiety without daily medication .  Prn alprazolam. Risk and benefits of medication were discussed with patient today

## 2014-04-09 DIAGNOSIS — K9049 Malabsorption due to intolerance, not elsewhere classified: Secondary | ICD-10-CM | POA: Insufficient documentation

## 2014-04-09 NOTE — Assessment & Plan Note (Signed)
Addressed diet, advised trial of beano and lactase.

## 2014-04-09 NOTE — Assessment & Plan Note (Signed)
No signs of thyroid disorder,  BMI is normal  I have recommended  low glycemic index diet and regular exercise a minimum of 5 days per week.

## 2014-06-12 ENCOUNTER — Other Ambulatory Visit: Payer: Self-pay | Admitting: Internal Medicine

## 2014-06-12 NOTE — Telephone Encounter (Signed)
Electronic request for Alprazolam.  Last OV and refill 4.13.15.  Please advise refill.

## 2014-06-12 NOTE — Telephone Encounter (Signed)
Ok to refill,  printed rx  

## 2014-06-12 NOTE — Telephone Encounter (Signed)
Rx faxed to [phone]

## 2014-09-18 ENCOUNTER — Telehealth: Payer: Self-pay | Admitting: Internal Medicine

## 2014-09-18 NOTE — Telephone Encounter (Signed)
I received an FMLA form from [employer] for this Patient.  I have no idea what for.  I thought we had forms for these.  She will need an appt

## 2014-09-19 NOTE — Telephone Encounter (Signed)
Left message for patient to return call to office. 

## 2014-09-19 NOTE — Telephone Encounter (Signed)
Patient scheduled appointment to discuss [facility] 10/02/14

## 2014-10-02 ENCOUNTER — Ambulatory Visit (INDEPENDENT_AMBULATORY_CARE_PROVIDER_SITE_OTHER): Payer: 59 | Admitting: Internal Medicine

## 2014-10-02 ENCOUNTER — Encounter: Payer: Self-pay | Admitting: Internal Medicine

## 2014-10-02 VITALS — BP 128/84 | HR 73 | Temp 98.8°F | Resp 16 | Ht 67.0 in | Wt 147.8 lb

## 2014-10-02 DIAGNOSIS — F32A Depression, unspecified: Secondary | ICD-10-CM

## 2014-10-02 DIAGNOSIS — F418 Other specified anxiety disorders: Secondary | ICD-10-CM

## 2014-10-02 DIAGNOSIS — F419 Anxiety disorder, unspecified: Secondary | ICD-10-CM

## 2014-10-02 DIAGNOSIS — F329 Major depressive disorder, single episode, unspecified: Secondary | ICD-10-CM

## 2014-10-02 MED ORDER — OMEPRAZOLE 20 MG PO CPDR: DELAYED_RELEASE_CAPSULE | ORAL | Status: DC

## 2014-10-02 MED ORDER — ALPRAZOLAM 0.5 MG PO TABS: ORAL_TABLET | ORAL | Status: DC
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
## 2014-10-02 NOTE — Progress Notes (Signed)
Pre-visit discussion using our clinic review tool. No additional management support is needed unless otherwise documented below in the visit note.  

## 2014-10-02 NOTE — Patient Instructions (Addendum)
Resume citalopram  At 1/2 tablet daily for the first few days,  Then a full tablet daily  Return in 3 weeks for dose titration

## 2014-10-05 ENCOUNTER — Encounter: Payer: Self-pay | Admitting: Internal Medicine

## 2014-10-05 DIAGNOSIS — Z7689 Persons encountering health services in other specified circumstances: Secondary | ICD-10-CM
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
## 2014-10-07 ENCOUNTER — Telehealth: Payer: Self-pay | Admitting: Internal Medicine

## 2014-10-07 NOTE — Telephone Encounter (Signed)
Pt presents to office for completed FMLA. Copy given. 1 copy sent to scan, 1 copy given for billing.

## 2014-10-07 NOTE — Telephone Encounter (Signed)
FMLA form returned for you for distribution to patient  Please copy to chart as well

## 2014-10-17 ENCOUNTER — Encounter: Payer: Self-pay | Admitting: Internal Medicine

## 2014-10-24 ENCOUNTER — Telehealth: Payer: Self-pay | Admitting: Internal Medicine

## 2014-10-24 NOTE — Telephone Encounter (Signed)
FMLA extension letter written and printed,  Please send to address below ASAP

## 2014-10-24 NOTE — Telephone Encounter (Signed)
Faxed to number below.

## 2014-10-24 NOTE — Telephone Encounter (Signed)
[name] called saying she needs the dates of her FMLA extended. Per [name] at [employer] [facility] company), Dr. Derrel Nip can write a note for the pt saying her FMLA needs to be extended to 11/10/14 and she can return to work on 11/11/14. As long as she signs the note she can fax it to [place] at [employer]. (Fax# [phone]) If you have any questions, feel free to call [name]. Pt ph# [phone] Thank you.

## 2014-11-18 ENCOUNTER — Ambulatory Visit (INDEPENDENT_AMBULATORY_CARE_PROVIDER_SITE_OTHER): Payer: 59 | Admitting: Internal Medicine

## 2014-11-18 ENCOUNTER — Encounter: Payer: Self-pay | Admitting: Internal Medicine

## 2014-11-18 VITALS — BP 110/60 | HR 69 | Temp 98.2°F | Resp 14 | Ht 66.0 in | Wt 148.5 lb

## 2014-11-18 DIAGNOSIS — R928 Other abnormal and inconclusive findings on diagnostic imaging of breast: Secondary | ICD-10-CM

## 2014-11-18 DIAGNOSIS — E785 Hyperlipidemia, unspecified: Secondary | ICD-10-CM

## 2014-11-18 DIAGNOSIS — Z0000 Encounter for general adult medical examination without abnormal findings: Secondary | ICD-10-CM

## 2014-11-18 DIAGNOSIS — Z23 Encounter for immunization: Secondary | ICD-10-CM

## 2014-11-18 DIAGNOSIS — R5383 Other fatigue: Secondary | ICD-10-CM

## 2014-11-18 DIAGNOSIS — F419 Anxiety disorder, unspecified: Secondary | ICD-10-CM

## 2014-11-18 DIAGNOSIS — F329 Major depressive disorder, single episode, unspecified: Secondary | ICD-10-CM

## 2014-11-18 DIAGNOSIS — F418 Other specified anxiety disorders: Secondary | ICD-10-CM

## 2014-11-18 LAB — CBC WITH DIFFERENTIAL/PLATELET
Basophils Absolute: 0 10*3/uL (ref 0.0–0.1)
Basophils Relative: 0.2 % (ref 0.0–3.0)
Eosinophils Absolute: 0.2 10*3/uL (ref 0.0–0.7)
Eosinophils Relative: 2.3 % (ref 0.0–5.0)
HCT: 43.4 % (ref 36.0–46.0)
Hemoglobin: 13.9 g/dL (ref 12.0–15.0)
Lymphocytes Relative: 23.6 % (ref 12.0–46.0)
Lymphs Abs: 2.4 10*3/uL (ref 0.7–4.0)
MCHC: 32.1 g/dL (ref 30.0–36.0)
MCV: 101.1 fl — ABNORMAL HIGH (ref 78.0–100.0)
Monocytes Absolute: 0.8 10*3/uL (ref 0.1–1.0)
Monocytes Relative: 8.2 % (ref 3.0–12.0)
Neutro Abs: 6.6 10*3/uL (ref 1.4–7.7)
Neutrophils Relative %: 65.7 % (ref 43.0–77.0)
Platelets: 255 10*3/uL (ref 150.0–400.0)
RBC: 4.29 Mil/uL (ref 3.87–5.11)
RDW: 13.6 % (ref 11.5–15.5)
WBC: 10 10*3/uL (ref 4.0–10.5)

## 2014-11-18 LAB — LIPID PANEL
Cholesterol: 220 mg/dL — ABNORMAL HIGH (ref 0–200)
HDL: 55.7 mg/dL (ref >39.00)
LDL Cholesterol: 141 mg/dL — ABNORMAL HIGH (ref 0–99)
NonHDL: 164.3
Total CHOL/HDL Ratio: 4
Triglycerides: 116 mg/dL (ref 0.0–149.0)
VLDL: 23.2 mg/dL (ref 0.0–40.0)

## 2014-11-18 LAB — COMPREHENSIVE METABOLIC PANEL
ALT: 15 U/L (ref 0–35)
AST: 19 U/L (ref 0–37)
Albumin: 4.4 g/dL (ref 3.5–5.2)
Alkaline Phosphatase: 58 U/L (ref 39–117)
BUN: 9 mg/dL (ref 6–23)
CO2: 26 mEq/L (ref 19–32)
Calcium: 9.4 mg/dL (ref 8.4–10.5)
Chloride: 100 mEq/L (ref 96–112)
Creatinine, Ser: 0.7 mg/dL (ref 0.4–1.2)
GFR: 102.99 mL/min (ref >60.00)
Glucose, Bld: 102 mg/dL — ABNORMAL HIGH (ref 70–99)
Potassium: 4.5 mEq/L (ref 3.5–5.1)
Sodium: 138 mEq/L (ref 135–145)
Total Bilirubin: 0.7 mg/dL (ref 0.2–1.2)
Total Protein: 7.6 g/dL (ref 6.0–8.3)

## 2014-11-18 LAB — TSH: TSH: 1.27 u[IU]/mL (ref 0.35–4.50)
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
## 2014-11-18 NOTE — Progress Notes (Signed)
Pre visit review using our clinic review tool, if applicable. No additional management support is needed unless otherwise documented below in the visit note. 

## 2014-11-18 NOTE — Patient Instructions (Signed)
You had your annual  wellness exam today.  We will repeat your PAP smear in 2017, sooner if needed    You received the TDaP vaccine today.  If you develop a sore arm , use tylenol and ibuprofen for a few days   We will contact you with the bloodwork results  Health Maintenance Adopting a healthy lifestyle and getting preventive care can go a long way to promote health and wellness. Talk with your health care provider about what schedule of regular examinations is right for you. This is a good chance for you to check in with your provider about disease prevention and staying healthy. In between checkups, there are plenty of things you can do on your own. Experts have done a lot of research about which lifestyle changes and preventive measures are most likely to keep you healthy. Ask your health care provider for more information. WEIGHT AND DIET  Eat a healthy diet  Be sure to include plenty of vegetables, fruits, low-fat dairy products, and lean protein.  Do not eat a lot of foods high in solid fats, added sugars, or salt.  Get regular exercise. This is one of the most important things you can do for your health.  Most adults should exercise for at least 150 minutes each week. The exercise should increase your heart rate and make you sweat (moderate-intensity exercise).  Most adults should also do strengthening exercises at least twice a week. This is in addition to the moderate-intensity exercise.  Maintain a healthy weight  Body mass index (BMI) is a measurement that can be used to identify possible weight problems. It estimates body fat based on height and weight. Your health care provider can help determine your BMI and help you achieve or maintain a healthy weight.  For females 31 years of age and older:   A BMI below 18.5 is considered underweight.  A BMI of 18.5 to 24.9 is normal.  A BMI of 25 to 29.9 is considered overweight.  A BMI of 30 and above is considered obese.   Watch levels of cholesterol and blood lipids  You should start having your blood tested for lipids and cholesterol at 49 years of age, then have this test every 5 years.  You may need to have your cholesterol levels checked more often if:  Your lipid or cholesterol levels are high.  You are older than 49 years of age.  You are at high risk for heart disease.  CANCER SCREENING   Lung Cancer  Lung cancer screening is recommended for adults 81-15 years old who are at high risk for lung cancer because of a history of smoking.  A yearly low-dose CT scan of the lungs is recommended for people who:  Currently smoke.  Have quit within the past 15 years.  Have at least a 30-pack-year history of smoking. A pack year is smoking an average of one pack of cigarettes a day for 1 year.  Yearly screening should continue until it has been 15 years since you quit.  Yearly screening should stop if you develop a health problem that would prevent you from having lung cancer treatment.  Breast Cancer  Practice breast self-awareness. This means understanding how your breasts normally appear and feel.  It also means doing regular breast self-exams. Let your health care provider know about any changes, no matter how small.  If you are in your 20s or 30s, you should have a clinical breast exam (CBE) by a health  care provider every 1-3 years as part of a regular health exam.  If you are 73 or older, have a CBE every year. Also consider having a breast X-ray (mammogram) every year.  If you have a family history of breast cancer, talk to your health care provider about genetic screening.  If you are at high risk for breast cancer, talk to your health care provider about having an MRI and a mammogram every year.  Breast cancer gene (BRCA) assessment is recommended for women who have family members with BRCA-related cancers. BRCA-related cancers  include:  Breast.  Ovarian.  Tubal.  Peritoneal cancers.  Results of the assessment will determine the need for genetic counseling and BRCA1 and BRCA2 testing. Cervical Cancer Routine pelvic examinations to screen for cervical cancer are no longer recommended for nonpregnant women who are considered low risk for cancer of the pelvic organs (ovaries, uterus, and vagina) and who do not have symptoms. A pelvic examination may be necessary if you have symptoms including those associated with pelvic infections. Ask your health care provider if a screening pelvic exam is right for you.   The Pap test is the screening test for cervical cancer for women who are considered at risk.  If you had a hysterectomy for a problem that was not cancer or a condition that could lead to cancer, then you no longer need Pap tests.  If you are older than 65 years, and you have had normal Pap tests for the past 10 years, you no longer need to have Pap tests.  If you have had past treatment for cervical cancer or a condition that could lead to cancer, you need Pap tests and screening for cancer for at least 20 years after your treatment.  If you no longer get a Pap test, assess your risk factors if they change (such as having a new sexual partner). This can affect whether you should start being screened again.  Some women have medical problems that increase their chance of getting cervical cancer. If this is the case for you, your health care provider may recommend more frequent screening and Pap tests.  The human papillomavirus (HPV) test is another test that may be used for cervical cancer screening. The HPV test looks for the virus that can cause cell changes in the cervix. The cells collected during the Pap test can be tested for HPV.  The HPV test can be used to screen women 48 years of age and older. Getting tested for HPV can extend the interval between normal Pap tests from three to five years.  An HPV  test also should be used to screen women of any age who have unclear Pap test results.  After 49 years of age, women should have HPV testing as often as Pap tests.  Colorectal Cancer  This type of cancer can be detected and often prevented.  Routine colorectal cancer screening usually begins at 49 years of age and continues through 49 years of age.  Your health care provider may recommend screening at an earlier age if you have risk factors for colon cancer.  Your health care provider may also recommend using home test kits to check for hidden blood in the stool.  A small camera at the end of a tube can be used to examine your colon directly (sigmoidoscopy or colonoscopy). This is done to check for the earliest forms of colorectal cancer.  Routine screening usually begins at age 62.  Direct examination of the  colon should be repeated every 5-10 years through 49 years of age. However, you may need to be screened more often if early forms of precancerous polyps or small growths are found. Skin Cancer  Check your skin from head to toe regularly.  Tell your health care provider about any new moles or changes in moles, especially if there is a change in a mole's shape or color.  Also tell your health care provider if you have a mole that is larger than the size of a pencil eraser.  Always use sunscreen. Apply sunscreen liberally and repeatedly throughout the day.  Protect yourself by wearing long sleeves, pants, a wide-brimmed hat, and sunglasses whenever you are outside. HEART DISEASE, DIABETES, AND HIGH BLOOD PRESSURE   Have your blood pressure checked at least every 1-2 years. High blood pressure causes heart disease and increases the risk of stroke.  If you are between 78 years and 29 years old, ask your health care provider if you should take aspirin to prevent strokes.  Have regular diabetes screenings. This involves taking a blood sample to check your fasting blood sugar  level.  If you are at a normal weight and have a low risk for diabetes, have this test once every three years after 49 years of age.  If you are overweight and have a high risk for diabetes, consider being tested at a younger age or more often. PREVENTING INFECTION  Hepatitis B  If you have a higher risk for hepatitis B, you should be screened for this virus. You are considered at high risk for hepatitis B if:  You were born in a country where hepatitis B is common. Ask your health care provider which countries are considered high risk.  Your parents were born in a high-risk country, and you have not been immunized against hepatitis B (hepatitis B vaccine).  You have HIV or AIDS.  You use needles to inject street drugs.  You live with someone who has hepatitis B.  You have had sex with someone who has hepatitis B.  You get hemodialysis treatment.  You take certain medicines for conditions, including cancer, organ transplantation, and autoimmune conditions. Hepatitis C  Blood testing is recommended for:  Everyone born from [birth year] through 1966.  Anyone with known risk factors for hepatitis C. Sexually transmitted infections (STIs)  You should be screened for sexually transmitted infections (STIs) including gonorrhea and chlamydia if:  You are sexually active and are younger than 49 years of age.  You are older than 49 years of age and your health care provider tells you that you are at risk for this type of infection.  Your sexual activity has changed since you were last screened and you are at an increased risk for chlamydia or gonorrhea. Ask your health care provider if you are at risk.  If you do not have HIV, but are at risk, it may be recommended that you take a prescription medicine daily to prevent HIV infection. This is called pre-exposure prophylaxis (PrEP). You are considered at risk if:  You are sexually active and do not regularly use condoms or know the HIV status  of your partner(s).  You take drugs by injection.  You are sexually active with a partner who has HIV. Talk with your health care provider about whether you are at high risk of being infected with HIV. If you choose to begin PrEP, you should first be tested for HIV. You should then be tested every 3 months  for as long as you are taking PrEP.  PREGNANCY   If you are premenopausal and you may become pregnant, ask your health care provider about preconception counseling.  If you may become pregnant, take 400 to 800 micrograms (mcg) of folic acid every day.  If you want to prevent pregnancy, talk to your health care provider about birth control (contraception). OSTEOPOROSIS AND MENOPAUSE   Osteoporosis is a disease in which the bones lose minerals and strength with aging. This can result in serious bone fractures. Your risk for osteoporosis can be identified using a bone density scan.  If you are 64 years of age or older, or if you are at risk for osteoporosis and fractures, ask your health care provider if you should be screened.  Ask your health care provider whether you should take a calcium or vitamin D supplement to lower your risk for osteoporosis.  Menopause may have certain physical symptoms and risks.  Hormone replacement therapy may reduce some of these symptoms and risks. Talk to your health care provider about whether hormone replacement therapy is right for you.  HOME CARE INSTRUCTIONS   Schedule regular health, dental, and eye exams.  Stay current with your immunizations.   Do not use any tobacco products including cigarettes, chewing tobacco, or electronic cigarettes.  If you are pregnant, do not drink alcohol.  If you are breastfeeding, limit how much and how often you drink alcohol.  Limit alcohol intake to no more than 1 drink per day for nonpregnant women. One drink equals 12 ounces of beer, 5 ounces of wine, or 1 ounces of hard liquor.  Do not use street  drugs.  Do not share needles.  Ask your health care provider for help if you need support or information about quitting drugs.  Tell your health care provider if you often feel depressed.  Tell your health care provider if you have ever been abused or do not feel safe at home. Document Released: 06/28/2011 Document Revised: 04/29/2014 Document Reviewed: 11/14/2013 [facility] Patient Information 2015 [place], [place]. This information is not intended to replace advice given to you by your health care provider. Make sure you discuss any questions you have with your health care provider.

## 2014-11-19 ENCOUNTER — Encounter: Payer: Self-pay | Admitting: Internal Medicine

## 2014-11-19 NOTE — Assessment & Plan Note (Signed)
Recent mammogram was normal
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
## 2014-11-19 NOTE — Assessment & Plan Note (Signed)

## 2014-11-22 ENCOUNTER — Encounter: Payer: Self-pay | Admitting: Internal Medicine

## 2015-02-05 ENCOUNTER — Ambulatory Visit: Payer: Self-pay | Admitting: Internal Medicine

## 2015-02-05 LAB — HM MAMMOGRAPHY: HM Mammogram: NEGATIVE

## 2015-02-12 ENCOUNTER — Encounter: Payer: Self-pay | Admitting: Internal Medicine

## 2015-02-12 DIAGNOSIS — N926 Irregular menstruation, unspecified: Secondary | ICD-10-CM

## 2015-02-12 DIAGNOSIS — R102 Pelvic and perineal pain: Secondary | ICD-10-CM

## 2015-02-12 NOTE — Telephone Encounter (Signed)
Patient has been asked to come in to do a urine pregnancy test prior to referral to gyn for left inguinal pain and recnet change in menstrual cycle.

## 2015-02-13 ENCOUNTER — Other Ambulatory Visit (INDEPENDENT_AMBULATORY_CARE_PROVIDER_SITE_OTHER): Payer: 59

## 2015-02-13 DIAGNOSIS — N926 Irregular menstruation, unspecified: Secondary | ICD-10-CM

## 2015-02-13 DIAGNOSIS — R102 Pelvic and perineal pain: Secondary | ICD-10-CM

## 2015-02-13 NOTE — Addendum Note (Signed)
Addended by: Karlene Einstein D on: 02/13/2015 01:36 PM   Modules accepted: Orders

## 2015-02-13 NOTE — Telephone Encounter (Signed)
Patient notified and lab scheduled.

## 2015-02-14 LAB — PREGNANCY, URINE: Preg Test, Ur: NEGATIVE

## 2017-05-26 ENCOUNTER — Other Ambulatory Visit: Payer: Self-pay | Admitting: Internal Medicine

## 2017-05-26 DIAGNOSIS — Z1231 Encounter for screening mammogram for malignant neoplasm of breast: Secondary | ICD-10-CM

## 2017-05-27 ENCOUNTER — Ambulatory Visit
Admission: RE | Admit: 2017-05-27 | Discharge: 2017-05-27 | Disposition: A | Discharge status: Home / Self Care | Payer: 59 | Source: Ambulatory Visit | Attending: Internal Medicine | Admitting: Internal Medicine

## 2017-05-27 DIAGNOSIS — Z1231 Encounter for screening mammogram for malignant neoplasm of breast: Secondary | ICD-10-CM

## 2018-06-08 ENCOUNTER — Other Ambulatory Visit: Payer: Self-pay

## 2018-06-08 DIAGNOSIS — Z1211 Encounter for screening for malignant neoplasm of colon: Secondary | ICD-10-CM

## 2018-06-28 ENCOUNTER — Encounter: Payer: Self-pay | Admitting: *Deleted

## 2018-06-28 ENCOUNTER — Other Ambulatory Visit: Payer: Self-pay

## 2018-07-06 NOTE — Discharge Instructions (Signed)
General Anesthesia, Adult, Care After °These instructions provide you with information about caring for yourself after your procedure. Your health care provider may also give you more specific instructions. Your treatment has been planned according to current medical practices, but problems sometimes occur. Call your health care provider if you have any problems or questions after your procedure. °What can I expect after the procedure? °After the procedure, it is common to have: °· Vomiting. °· A sore throat. °· Mental slowness. ° °It is common to feel: °· Nauseous. °· Cold or shivery. °· Sleepy. °· Tired. °· Sore or achy, even in parts of your body where you did not have surgery. ° °Follow these instructions at home: °For at least 24 hours after the procedure: °· Do not: °? Participate in activities where you could fall or become injured. °? Drive. °? Use heavy machinery. °? Drink alcohol. °? Take sleeping pills or medicines that cause drowsiness. °? Make important decisions or sign legal documents. °? Take care of children on your own. °· Rest. °Eating and drinking °· If you vomit, drink water, juice, or soup when you can drink without vomiting. °· Drink enough fluid to keep your urine clear or pale yellow. °· Make sure you have little or no nausea before eating solid foods. °· Follow the diet recommended by your health care provider. °General instructions °· Have a responsible adult stay with you until you are awake and alert. °· Return to your normal activities as told by your health care provider. Ask your health care provider what activities are safe for you. °· Take over-the-counter and prescription medicines only as told by your health care provider. °· If you smoke, do not smoke without supervision. °· Keep all follow-up visits as told by your health care provider. This is important. °Contact a health care provider if: °· You continue to have nausea or vomiting at home, and medicines are not helpful. °· You  cannot drink fluids or start eating again. °· You cannot urinate after 8-12 hours. °· You develop a skin rash. °· You have fever. °· You have increasing redness at the site of your procedure. °Get help right away if: °· You have difficulty breathing. °· You have chest pain. °· You have unexpected bleeding. °· You feel that you are having a life-threatening or urgent problem. °This information is not intended to replace advice given to you by your health care provider. Make sure you discuss any questions you have with your health care provider. °Document Released: 03/21/2001 Document Revised: 05/17/2016 Document Reviewed: 11/27/2015 °Elsevier Interactive Patient Education © 2018 [employer] ° °

## 2018-07-07 ENCOUNTER — Ambulatory Visit: Payer: 59 | Admitting: Anesthesiology

## 2018-07-07 ENCOUNTER — Ambulatory Visit
Admission: RE | Admit: 2018-07-07 | Discharge: 2018-07-07 | Disposition: A | Discharge status: Home / Self Care | Payer: 59 | Source: Ambulatory Visit | Attending: Gastroenterology | Admitting: Gastroenterology

## 2018-07-07 ENCOUNTER — Encounter
Admission: RE | Disposition: A | Discharge status: Home / Self Care | Payer: Self-pay | Source: Ambulatory Visit | Attending: Gastroenterology

## 2018-07-07 DIAGNOSIS — K640 First degree hemorrhoids: Secondary | ICD-10-CM | POA: Insufficient documentation

## 2018-07-07 DIAGNOSIS — Z87891 Personal history of nicotine dependence: Secondary | ICD-10-CM | POA: Diagnosis not present

## 2018-07-07 DIAGNOSIS — Z1211 Encounter for screening for malignant neoplasm of colon: Secondary | ICD-10-CM | POA: Diagnosis not present

## 2018-07-07 DIAGNOSIS — K5730 Diverticulosis of large intestine without perforation or abscess without bleeding: Secondary | ICD-10-CM | POA: Diagnosis not present

## 2018-07-07 HISTORY — DX: Unspecified osteoarthritis, unspecified site: M19.90

## 2018-07-07 HISTORY — PX: COLONOSCOPY WITH PROPOFOL: SHX5780

## 2018-07-07 SURGERY — COLONOSCOPY WITH PROPOFOL
Anesthesia: General | Wound class: Contaminated

## 2018-07-07 MED ORDER — ACETAMINOPHEN 325 MG PO TABS: 650.0000 mg | ORAL_TABLET | Freq: Once | ORAL | Status: DC | PRN

## 2018-07-07 MED ORDER — ACETAMINOPHEN 160 MG/5ML PO SOLN: 325.0000 mg | ORAL | Status: DC | PRN

## 2018-07-07 MED ORDER — SODIUM CHLORIDE 0.9 % IV SOLN: INTRAVENOUS | Status: DC

## 2018-07-07 MED ORDER — LACTATED RINGERS IV SOLN
INTRAVENOUS | Status: DC
  Administered 2018-07-07: 09:00:00 via INTRAVENOUS

## 2018-07-07 MED ORDER — LIDOCAINE HCL (CARDIAC) PF 100 MG/5ML IV SOSY
PREFILLED_SYRINGE | INTRAVENOUS | Status: DC | PRN
  Administered 2018-07-07: 30 mg via INTRAVENOUS

## 2018-07-07 MED ORDER — PROPOFOL 10 MG/ML IV BOLUS
INTRAVENOUS | Status: DC | PRN
  Administered 2018-07-07: 20 mg via INTRAVENOUS
  Administered 2018-07-07: 30 mg via INTRAVENOUS
  Administered 2018-07-07: 80 mg via INTRAVENOUS
  Administered 2018-07-07: 50 mg via INTRAVENOUS
  Administered 2018-07-07 (×3): 40 mg via INTRAVENOUS
  Administered 2018-07-07: 80 mg via INTRAVENOUS

## 2018-07-07 MED ORDER — ONDANSETRON HCL 4 MG/2ML IJ SOLN: 4.0000 mg | Freq: Once | INTRAMUSCULAR | Status: DC | PRN

## 2018-07-07 SURGICAL SUPPLY — 24 items
CANISTER SUCT 1200ML W/VALVE (MISCELLANEOUS) ×2 IMPLANT
CLIP HMST 235XBRD CATH ROT (MISCELLANEOUS) IMPLANT
CLIP RESOLUTION 360 11X235 (MISCELLANEOUS)
ELECT REM PT RETURN 9FT ADLT (ELECTROSURGICAL)
ELECTRODE REM PT RTRN 9FT ADLT (ELECTROSURGICAL) IMPLANT
FCP ESCP3.2XJMB 240X2.8X (MISCELLANEOUS)
FORCEPS BIOP RAD 4 LRG CAP 4 (CUTTING FORCEPS) IMPLANT
FORCEPS BIOP RJ4 240 W/NDL (MISCELLANEOUS)
FORCEPS ESCP3.2XJMB 240X2.8X (MISCELLANEOUS) IMPLANT
GOWN CVR UNV OPN BCK APRN NK (MISCELLANEOUS) ×2 IMPLANT
GOWN ISOL THUMB LOOP REG UNIV (MISCELLANEOUS) ×4
INJECTOR VARIJECT VIN23 (MISCELLANEOUS) IMPLANT
KIT DEFENDO VALVE AND CONN (KITS) IMPLANT
KIT ENDO PROCEDURE OLY (KITS) ×2 IMPLANT
MARKER SPOT ENDO TATTOO 5ML (MISCELLANEOUS) IMPLANT
PROBE APC STR FIRE (PROBE) IMPLANT
RETRIEVER NET ROTH 2.5X230 LF (MISCELLANEOUS) IMPLANT
SNARE SHORT THROW 13M SML OVAL (MISCELLANEOUS) IMPLANT
SNARE SHORT THROW 30M LRG OVAL (MISCELLANEOUS) IMPLANT
SNARE SNG USE RND 15MM (INSTRUMENTS) IMPLANT
SPOT EX ENDOSCOPIC TATTOO (MISCELLANEOUS)
TRAP ETRAP POLY (MISCELLANEOUS) IMPLANT
VARIJECT INJECTOR VIN23 (MISCELLANEOUS)
WATER STERILE IRR 250ML POUR (IV SOLUTION) ×2 IMPLANT

## 2018-07-07 NOTE — Transfer of Care (Signed)
Immediate Anesthesia Transfer of Care Note  Patient: Jessica Thornton  Procedure(s) Performed: COLONOSCOPY WITH PROPOFOL (N/A )  Patient Location: PACU  Anesthesia Type: General  Level of Consciousness: awake, alert  and patient cooperative  Airway and Oxygen Therapy: Patient Spontanous Breathing and Patient connected to supplemental oxygen  Post-op Assessment: Post-op Vital signs reviewed, Patient's Cardiovascular Status Stable, Respiratory Function Stable, Patent Airway and No signs of Nausea or vomiting  Post-op Vital Signs: Reviewed and stable  Complications: No apparent anesthesia complications

## 2018-07-07 NOTE — Anesthesia Postprocedure Evaluation (Signed)
Anesthesia Post Note  Patient: Jessica Thornton  Procedure(s) Performed: COLONOSCOPY WITH PROPOFOL (N/A )  Patient location during evaluation: PACU Anesthesia Type: General Level of consciousness: awake and alert, oriented and patient cooperative Pain management: pain level controlled Vital Signs Assessment: post-procedure vital signs reviewed and stable Respiratory status: spontaneous breathing, nonlabored ventilation and respiratory function stable Cardiovascular status: blood pressure returned to baseline and stable Postop Assessment: adequate PO intake Anesthetic complications: no    Darrin Nipper

## 2018-07-07 NOTE — H&P (Signed)
Jessica Lame, MD [address] [place], [place] [address] Phone: [phone] Fax : [phone]  Primary Care Physician:  Crecencio Mc, MD Primary Gastroenterologist:  Dr. Allen Norris  Pre-Procedure History & Physical: HPI:  Jessica Thornton is a 52 y.o. female is here for a screening colonoscopy.   Past Medical History:  Diagnosis Date  . Allergy   . Arthritis    knees  . Depression   . GERD (gastroesophageal reflux disease)   . UTI (urinary tract infection)     Past Surgical History:  Procedure Laterality Date  . TUBAL LIGATION  1993    Prior to Admission medications   Medication Sig Start Date End Date Taking? Authorizing Provider  cetirizine (ZYRTEC) 10 MG tablet Take 10 mg by mouth daily.   Yes [provider]  Probiotic Product (PROBIOTIC PO) Take by mouth daily.   Yes [provider]    Allergies as of 06/08/2018  . (No Known Allergies)    Family History  Problem Relation Age of Onset  . Alcohol abuse Father   . Arthritis Father   . Hyperlipidemia Father   . Stroke Father   . Hypertension Father   . Hyperlipidemia Maternal Grandfather   . Hypertension Maternal Grandfather   . Diabetes Maternal Grandfather   . Arthritis Paternal Grandmother   . Cancer Maternal Aunt        pancreatic ca  . Mental illness Maternal Aunt   . Cancer Maternal Uncle 38       liver CA  . Mental retardation Maternal Uncle   . Cancer Maternal Aunt 66       anorectal CA  . Cancer Maternal Uncle        gastric adenoca  . Breast cancer Neg Hx     Social History   Socioeconomic History  . Marital status: Married    Spouse name: Not on file  . Number of children: Not on file  . Years of education: Not on file  . Highest education level: Not on file  Occupational History  . Not on file  Social Needs  . Financial resource strain: Not on file  . Food insecurity:    Worry: Not on file    Inability: Not on file  . Transportation needs:   Medical: Not on file    Non-medical: Not on file  Tobacco Use  . Smoking status: Former Smoker    Last attempt to quit: 01/22/1993    Years since quitting: 25.4  . Smokeless tobacco: Never Used  Substance and Sexual Activity  . Alcohol use: Yes    Alcohol/week: 2.4 oz    Types: 4 Glasses of wine per week  . Drug use: No  . Sexual activity: Yes    Birth control/protection: Surgical    Comment: tubal ligation   Lifestyle  . Physical activity:    Days per week: Not on file    Minutes per session: Not on file  . Stress: Not on file  Relationships  . Social connections:    Talks on phone: Not on file    Gets together: Not on file    Attends religious service: Not on file    Active member of club or organization: Not on file    Attends meetings of clubs or organizations: Not on file    Relationship status: Not on file  . Intimate partner violence:    Fear of current or ex partner: Not on file  Emotionally abused: Not on file    Physically abused: Not on file    Forced sexual activity: Not on file  Other Topics Concern  . Not on file  Social History Narrative  . Not on file    Review of Systems: See HPI, otherwise negative ROS  Physical Exam: BP (!) 118/54   Pulse 67   Temp 97.7 F (36.5 C) (Temporal)   Resp 15   Ht 5\' 6"  (1.676 m)   Wt 153 lb (69.4 kg)   LMP 12/10/2017 (Approximate) Comment: Negative Preg Test  SpO2 99%   BMI 24.69 kg/m  General:   Alert,  pleasant and cooperative in NAD Head:  Normocephalic and atraumatic. Neck:  Supple; no masses or thyromegaly. Lungs:  Clear throughout to auscultation.    Heart:  Regular rate and rhythm. Abdomen:  Soft, nontender and nondistended. Normal bowel sounds, without guarding, and without rebound.   Neurologic:  Alert and  oriented x4;  grossly normal neurologically.  Impression/Plan: Jessica Thornton is now here to undergo a screening colonoscopy.  Risks, benefits, and alternatives regarding colonoscopy have  been reviewed with the patient.  Questions have been answered.  All parties agreeable.

## 2018-07-07 NOTE — Anesthesia Procedure Notes (Signed)
Procedure Name: MAC Date/Time: 07/07/2018 8:56 AM Performed by: Lind Guest, CRNA Pre-anesthesia Checklist: Patient identified, Emergency Drugs available, Suction available, Patient being monitored and Timeout performed Patient Re-evaluated:Patient Re-evaluated prior to induction Oxygen Delivery Method: Nasal cannula

## 2018-07-07 NOTE — Op Note (Signed)
[facility] Patient Name: Jessica Thornton Procedure Date: 07/07/2018 8:49 AM MRN: [id number] Account #: [id number] Date of Birth: 04-20-1966 Admit Type: Outpatient Age: 52 Room: [facility] OR ROOM 01 Gender: Female Note Status: Finalized Procedure:            Colonoscopy Indications:          Screening for colorectal malignant neoplasm Providers:            Lucilla Lame MD, MD Referring MD:         Deborra Medina, MD (Referring MD) Medicines:            Propofol per Anesthesia Complications:        No immediate complications. Procedure:            Pre-Anesthesia Assessment:                       - Prior to the procedure, a History and Physical was                        performed, and patient medications and allergies were                        reviewed. The patient's tolerance of previous                        anesthesia was also reviewed. The risks and benefits of                        the procedure and the sedation options and risks were                        discussed with the patient. All questions were                        answered, and informed consent was obtained. Prior                        Anticoagulants: The patient has taken no previous                        anticoagulant or antiplatelet agents. ASA Grade                        Assessment: II - A patient with mild systemic disease.                        After reviewing the risks and benefits, the patient was                        deemed in satisfactory condition to undergo the                        procedure.                       After obtaining informed consent, the colonoscope was                        passed under direct vision. Throughout the procedure,  the patient's blood pressure, pulse, and oxygen                        saturations were monitored continuously. The was                        introduced through the anus and advanced to the the                cecum, identified by appendiceal orifice and ileocecal                        valve. The colonoscopy was performed without                        difficulty. The patient tolerated the procedure well.                        The quality of the bowel preparation was excellent. Findings:      The perianal and digital rectal examinations were normal.      A few small-mouthed diverticula were found in the sigmoid colon.      Non-bleeding internal hemorrhoids were found during retroflexion. The       hemorrhoids were Grade I (internal hemorrhoids that do not prolapse). Impression:           - Diverticulosis in the sigmoid colon.                       - Non-bleeding internal hemorrhoids.                       - No specimens collected. Recommendation:       - Discharge patient to home.                       - Resume previous diet.                       - Continue present medications.                       - Repeat colonoscopy in 10 years for screening unless                        any change in family history or lower GI problems. Procedure Code(s):    --- Professional ---                       [phone], Colonoscopy, flexible; diagnostic, including                        collection of specimen(s) by brushing or washing, when                        performed (separate procedure) Diagnosis Code(s):    --- Professional ---                       Z12.11, Encounter for screening for malignant neoplasm                        of colon CPT copyright 2017 American Medical Association. All rights reserved. The codes documented  in this report are preliminary and upon coder review may  be revised to meet current compliance requirements. Lucilla Lame MD, MD 07/07/2018 9:15:29 AM This report has been signed electronically. Number of Addenda: 0 Note Initiated On: 07/07/2018 8:49 AM Scope Withdrawal Time: 0 hours 6 minutes 51 seconds  Total Procedure Duration: 0 hours 15 minutes 44 seconds        [facility]

## 2018-07-07 NOTE — Anesthesia Preprocedure Evaluation (Signed)
Anesthesia Evaluation  Patient identified by MRN, date of birth, ID band Patient awake    Reviewed: Allergy & Precautions, NPO status , Patient's Chart, lab work & pertinent test results  History of Anesthesia Complications Negative for: history of anesthetic complications  Airway Mallampati: I  TM Distance: >3 FB Neck ROM: Full    Dental no notable dental hx.    Pulmonary former smoker (quit 1994),    Pulmonary exam normal breath sounds clear to auscultation       Cardiovascular Exercise Tolerance: Good negative cardio ROS Normal cardiovascular exam Rhythm:Regular Rate:Normal     Neuro/Psych PSYCHIATRIC DISORDERS Anxiety Depression negative neurological ROS     GI/Hepatic negative GI ROS,   Endo/Other  negative endocrine ROS  Renal/GU negative Renal ROS     Musculoskeletal  (+) Arthritis , Osteoarthritis,    Abdominal   Peds  Hematology negative hematology ROS (+)   Anesthesia Other Findings   Reproductive/Obstetrics                             Anesthesia Physical Anesthesia Plan  ASA: II  Anesthesia Plan: General   Post-op Pain Management:    Induction: Intravenous  PONV Risk Score and Plan: 3 and TIVA and Propofol infusion  Airway Management Planned: Natural Airway  Additional Equipment:   Intra-op Plan:   Post-operative Plan:   Informed Consent: I have reviewed the patients History and Physical, chart, labs and discussed the procedure including the risks, benefits and alternatives for the proposed anesthesia with the patient or authorized representative who has indicated his/her understanding and acceptance.     Plan Discussed with: CRNA  Anesthesia Plan Comments:         Anesthesia Quick Evaluation

## 2018-09-25 ENCOUNTER — Other Ambulatory Visit: Payer: Self-pay | Admitting: Internal Medicine

## 2018-09-25 DIAGNOSIS — Z1231 Encounter for screening mammogram for malignant neoplasm of breast: Secondary | ICD-10-CM

## 2018-10-12 ENCOUNTER — Ambulatory Visit
Admission: RE | Admit: 2018-10-12 | Discharge: 2018-10-12 | Disposition: A | Discharge status: Home / Self Care | Payer: 59 | Source: Ambulatory Visit | Attending: Internal Medicine | Admitting: Internal Medicine

## 2018-10-12 ENCOUNTER — Encounter: Payer: Self-pay | Admitting: Radiology

## 2018-10-12 DIAGNOSIS — Z1231 Encounter for screening mammogram for malignant neoplasm of breast: Secondary | ICD-10-CM | POA: Insufficient documentation

## 2019-12-03 ENCOUNTER — Other Ambulatory Visit: Payer: Self-pay | Admitting: Obstetrics and Gynecology

## 2019-12-03 DIAGNOSIS — Z1231 Encounter for screening mammogram for malignant neoplasm of breast: Secondary | ICD-10-CM

## 2019-12-13 ENCOUNTER — Ambulatory Visit
Admission: RE | Admit: 2019-12-13 | Discharge: 2019-12-13 | Disposition: A | Discharge status: Home / Self Care | Payer: 59 | Source: Ambulatory Visit | Attending: Obstetrics and Gynecology | Admitting: Obstetrics and Gynecology

## 2019-12-13 DIAGNOSIS — Z1231 Encounter for screening mammogram for malignant neoplasm of breast: Secondary | ICD-10-CM | POA: Diagnosis not present

## 2020-03-14 ENCOUNTER — Ambulatory Visit: Payer: Self-pay | Attending: Internal Medicine

## 2021-01-11 ENCOUNTER — Telehealth: Payer: 59 | Admitting: Family

## 2021-01-11 DIAGNOSIS — J329 Chronic sinusitis, unspecified: Secondary | ICD-10-CM

## 2021-01-11 DIAGNOSIS — B9789 Other viral agents as the cause of diseases classified elsewhere: Secondary | ICD-10-CM | POA: Diagnosis not present

## 2021-01-11 MED ORDER — FLUTICASONE PROPIONATE 50 MCG/ACT NA SUSP: 2.0000 | Freq: Every day | NASAL | 6 refills | Status: DC

## 2021-01-11 NOTE — Progress Notes (Signed)

## 2021-01-27 ENCOUNTER — Other Ambulatory Visit: Payer: Self-pay | Admitting: Obstetrics and Gynecology

## 2021-01-27 ENCOUNTER — Other Ambulatory Visit: Payer: Self-pay | Admitting: Internal Medicine

## 2021-01-27 DIAGNOSIS — Z1231 Encounter for screening mammogram for malignant neoplasm of breast: Secondary | ICD-10-CM

## 2021-02-04 ENCOUNTER — Ambulatory Visit
Admission: RE | Admit: 2021-02-04 | Discharge: 2021-02-04 | Disposition: A | Discharge status: Home / Self Care | Payer: 59 | Source: Ambulatory Visit | Attending: Obstetrics and Gynecology | Admitting: Obstetrics and Gynecology

## 2021-02-04 ENCOUNTER — Other Ambulatory Visit: Payer: Self-pay

## 2021-02-04 DIAGNOSIS — Z1231 Encounter for screening mammogram for malignant neoplasm of breast: Secondary | ICD-10-CM | POA: Insufficient documentation

## 2021-02-09 ENCOUNTER — Other Ambulatory Visit: Payer: Self-pay | Admitting: Obstetrics and Gynecology

## 2021-02-10 ENCOUNTER — Other Ambulatory Visit: Payer: Self-pay | Admitting: Obstetrics and Gynecology

## 2021-02-10 DIAGNOSIS — N6489 Other specified disorders of breast: Secondary | ICD-10-CM

## 2021-02-10 DIAGNOSIS — R928 Other abnormal and inconclusive findings on diagnostic imaging of breast: Secondary | ICD-10-CM

## 2021-02-11 ENCOUNTER — Other Ambulatory Visit: Payer: Self-pay | Admitting: Obstetrics and Gynecology

## 2021-02-11 DIAGNOSIS — N6489 Other specified disorders of breast: Secondary | ICD-10-CM

## 2021-02-11 DIAGNOSIS — R928 Other abnormal and inconclusive findings on diagnostic imaging of breast: Secondary | ICD-10-CM

## 2021-02-13 ENCOUNTER — Ambulatory Visit
Admission: RE | Admit: 2021-02-13 | Discharge: 2021-02-13 | Disposition: A | Discharge status: Home / Self Care | Payer: 59 | Source: Ambulatory Visit | Attending: Obstetrics and Gynecology | Admitting: Obstetrics and Gynecology

## 2021-02-13 ENCOUNTER — Other Ambulatory Visit: Payer: Self-pay

## 2021-02-13 DIAGNOSIS — R928 Other abnormal and inconclusive findings on diagnostic imaging of breast: Secondary | ICD-10-CM | POA: Insufficient documentation

## 2021-02-13 DIAGNOSIS — N6489 Other specified disorders of breast: Secondary | ICD-10-CM

## 2021-05-19 LAB — HM PAP SMEAR: HM Pap smear: NORMAL

## 2021-05-28 ENCOUNTER — Encounter: Payer: Self-pay | Admitting: Emergency Medicine

## 2021-05-28 ENCOUNTER — Ambulatory Visit
Admission: EM | Admit: 2021-05-28 | Discharge: 2021-05-28 | Disposition: A | Discharge status: Home / Self Care | Payer: 59 | Attending: Physician Assistant | Admitting: Physician Assistant

## 2021-05-28 ENCOUNTER — Other Ambulatory Visit: Payer: Self-pay

## 2021-05-28 DIAGNOSIS — J029 Acute pharyngitis, unspecified: Secondary | ICD-10-CM | POA: Diagnosis not present

## 2021-05-28 DIAGNOSIS — J019 Acute sinusitis, unspecified: Secondary | ICD-10-CM | POA: Diagnosis not present

## 2021-05-28 LAB — GROUP A STREP BY PCR: Group A Strep by PCR: NOT DETECTED

## 2021-05-28 MED ORDER — AMOXICILLIN-POT CLAVULANATE 875-125 MG PO TABS: 1.0000 | ORAL_TABLET | Freq: Two times a day (BID) | ORAL | 0 refills | Status: AC

## 2021-05-28 NOTE — ED Triage Notes (Signed)
Pt is present today with a sore throat. Pt states that her sx started Friday

## 2021-05-28 NOTE — Discharge Instructions (Signed)
Your strep test is negative.  This could all be a viral infection, but if not feeling better in the next 3 days you can fill and take the antibiotic to treat for possible secondary bacterial sinus infection.  Take Mucinex D and increase fluid intake.  Use Flonase 1 squirt in each nostril twice a day and continue to perform the sinus rinses.  Can take Tylenol and/or NSAIDs for generalized achiness and sore throat.

## 2021-05-28 NOTE — ED Provider Notes (Signed)
[facility]    CSN: [id number] Arrival date & time: 05/28/21  0803      History   Chief Complaint Chief Complaint  Patient presents with  . Sore Throat    HPI Jessica Thornton is a 55 y.o. female presenting for sore throat that started a few days ago.  Patient states that she started to have congestion and sinus pressure along with a mild cough 9 days ago.  She says a sore throat is new.  Admits to postnasal drainage and nasal congestion.  Admits to fatigue and occasional headaches.  Patient says that her parents were sick with similar symptoms a couple weeks ago.  No COVID exposure to her knowledge.  Vaccinated for COVID-19 x2.  Patient says she has been taking Mucinex and using over-the-counter NSAIDs and nasal sinus rinses with some improvement in symptoms but she is concerned about the throat pain.  She has not had any fevers, chest tightness or pain, breathing difficulty, nausea/vomiting or diarrhea.  Patient is otherwise healthy.  No other complaints or concerns.  HPI  Past Medical History:  Diagnosis Date  . Allergy   . Arthritis    knees  . Depression   . GERD (gastroesophageal reflux disease)   . UTI (urinary tract infection)     Patient Active Problem List   Diagnosis Date Noted  . Encounter for screening colonoscopy   . Fatigue 11/18/2014  . 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 Prepatellar bursitis of right knee 04/08/2014  . Cystitis 04/08/2014  . Visit for preventive health examination 04/24/2013  . Abnormal mammogram 01/23/2013  . Anxiety and depression 01/23/2013    Past Surgical History:  Procedure Laterality Date  . COLONOSCOPY WITH PROPOFOL N/A 07/07/2018   Procedure: COLONOSCOPY WITH PROPOFOL;  Surgeon: Lucilla Lame, MD;  Location: [place];  Service: Endoscopy;  Laterality: N/A;  . TUBAL LIGATION  1993    OB History   No obstetric history on file.      Home Medications    Prior to Admission medications    Medication Sig Start Date End Date Taking? Authorizing Provider  amoxicillin-clavulanate (AUGMENTIN) 875-125 MG tablet Take 1 tablet by mouth every 12 (twelve) hours for 7 days. 05/28/21 06/04/21 Yes Danton Clap, PA-C  cetirizine (ZYRTEC) 10 MG tablet Take 10 mg by mouth daily.    [provider]  fluticasone (FLONASE) 50 MCG/ACT nasal spray Place 2 sprays into both nostrils daily. 01/11/21   Sharion Balloon, FNP  Probiotic Product (PROBIOTIC PO) Take by mouth daily.    [provider]    Family History Family History  Problem Relation Age of Onset  . Alcohol abuse Father   . Arthritis Father   . Hyperlipidemia Father   . Stroke Father   . Hypertension Father   . Hyperlipidemia Maternal Grandfather   . Hypertension Maternal Grandfather   . Diabetes Maternal Grandfather   . Arthritis Paternal Grandmother   . Cancer Maternal Aunt        pancreatic ca  . Mental illness Maternal Aunt   . Cancer Maternal Uncle 4       liver CA  . Mental retardation Maternal Uncle   . Cancer Maternal Aunt 66       anorectal CA  . Cancer Maternal Uncle        gastric adenoca  . Breast cancer Neg Hx     Social History Social History   Tobacco Use  . Smoking status: Former  Smoker    Quit date: 01/22/1993    Years since quitting: 28.3  . Smokeless tobacco: Never Used  Vaping Use  . Vaping Use: Never used  Substance Use Topics  . Alcohol use: Yes    Alcohol/week: 4.0 standard drinks    Types: 4 Glasses of wine per week  . Drug use: No     Allergies   Patient has no known allergies.   Review of Systems Review of Systems  Constitutional: Positive for fatigue. Negative for chills, diaphoresis and fever.  HENT: Positive for congestion, ear pain, postnasal drip, rhinorrhea, sinus pressure and sore throat.   Respiratory: Positive for cough. Negative for shortness of breath.   Gastrointestinal: Negative for abdominal pain, nausea and vomiting.  Musculoskeletal: Negative  for arthralgias and myalgias.  Skin: Negative for rash.  Neurological: Negative for weakness and headaches.  Hematological: Positive for adenopathy.     Physical Exam Triage Vital Signs ED Triage Vitals  Enc Vitals Group     BP 05/28/21 0811 (!) 152/78     Pulse Rate 05/28/21 0811 79     Resp 05/28/21 0811 18     Temp 05/28/21 0811 98.4 F (36.9 C)     Temp Source 05/28/21 0811 Oral     SpO2 05/28/21 0811 98 %     Weight --      Height --      Head Circumference --      Peak Flow --      Pain Score 05/28/21 0809 7     Pain Loc --      Pain Edu? --      Excl. in [place]? --    No data found.  Updated Vital Signs BP (!) 152/78 (BP Location: Left Arm)   Pulse 79   Temp 98.4 F (36.9 C) (Oral)   Resp 18   LMP 12/10/2017 (Approximate)   SpO2 98%    Physical Exam Vitals and nursing note reviewed.  Constitutional:      General: She is not in acute distress.    Appearance: Normal appearance. She is well-developed. She is not ill-appearing or toxic-appearing.  HENT:     Head: Normocephalic and atraumatic.     Right Ear: Tympanic membrane, ear canal and external ear normal.     Left Ear: Tympanic membrane, ear canal and external ear normal.     Nose: Congestion present.     Mouth/Throat:     Mouth: Mucous membranes are moist.     Pharynx: Oropharynx is clear. Posterior oropharyngeal erythema present.  Eyes:     General: No scleral icterus.       Right eye: No discharge.        Left eye: No discharge.     Conjunctiva/sclera: Conjunctivae normal.  Cardiovascular:     Rate and Rhythm: Normal rate and regular rhythm.     Heart sounds: Normal heart sounds.  Pulmonary:     Effort: Pulmonary effort is normal. No respiratory distress.     Breath sounds: Normal breath sounds.  Musculoskeletal:     Cervical back: Neck supple.  Skin:    General: Skin is dry.  Neurological:     General: No focal deficit present.     Mental Status: She is alert. Mental status is at baseline.      Motor: No weakness.     Gait: Gait normal.  Psychiatric:        Mood and Affect: Mood normal.  Behavior: Behavior normal.        Thought Content: Thought content normal.      UC Treatments / Results  Labs (all labs ordered are listed, but only abnormal results are displayed) Labs Reviewed  GROUP A STREP BY PCR    EKG   Radiology No results found.  Procedures Procedures (including critical care time)  Medications Ordered in UC Medications - No data to display  Initial Impression / Assessment and Plan / UC Course  I have reviewed the triage vital signs and the nursing notes.  Pertinent labs & imaging results that were available during my care of the patient were reviewed by me and considered in my medical decision making (see chart for details).   55 year old female presenting for 9-day history of nasal congestion and sinus pressure with new onset sore throat for the past couple of days.  Molecular strep test is negative today.  Reviewed result with patient.  Suspect she most likely has a viral sinusitis, but since it has been ongoing and seems to be getting worse instead of better, I have printed her a prescription for Augmentin to fill in 3 days if not improved with switching to Mucinex D and using Flonase along with the sinus rinses and NSAIDs.  Advised to follow-up with [place] as needed for any worsening symptoms or if not better after taking antibiotic.   Final Clinical Impressions(s) / UC Diagnoses   Final diagnoses:  Acute sinusitis, recurrence not specified, unspecified location  Sore throat     Discharge Instructions     Your strep test is negative.  This could all be a viral infection, but if not feeling better in the next 3 days you can fill and take the antibiotic to treat for possible secondary bacterial sinus infection.  Take Mucinex D and increase fluid intake.  Use Flonase 1 squirt in each nostril twice a day and continue to perform the sinus  rinses.  Can take Tylenol and/or NSAIDs for generalized achiness and sore throat.    ED Prescriptions    Medication Sig Dispense Auth. Provider   amoxicillin-clavulanate (AUGMENTIN) 875-125 MG tablet Take 1 tablet by mouth every 12 (twelve) hours for 7 days. 14 tablet Gretta Cool     PDMP not reviewed this encounter.   Danton Clap, PA-C 05/28/21 [phone]

## 2022-01-21 ENCOUNTER — Other Ambulatory Visit: Payer: Self-pay

## 2022-01-21 ENCOUNTER — Ambulatory Visit (INDEPENDENT_AMBULATORY_CARE_PROVIDER_SITE_OTHER): Payer: 59 | Admitting: Dermatology

## 2022-01-21 ENCOUNTER — Encounter: Payer: Self-pay | Admitting: Dermatology

## 2022-01-21 DIAGNOSIS — L578 Other skin changes due to chronic exposure to nonionizing radiation: Secondary | ICD-10-CM

## 2022-01-21 DIAGNOSIS — D2361 Other benign neoplasm of skin of right upper limb, including shoulder: Secondary | ICD-10-CM

## 2022-01-21 DIAGNOSIS — L570 Actinic keratosis: Secondary | ICD-10-CM

## 2022-01-21 DIAGNOSIS — D229 Melanocytic nevi, unspecified: Secondary | ICD-10-CM

## 2022-01-21 DIAGNOSIS — D225 Melanocytic nevi of trunk: Secondary | ICD-10-CM

## 2022-01-21 DIAGNOSIS — I781 Nevus, non-neoplastic: Secondary | ICD-10-CM

## 2022-01-21 DIAGNOSIS — L821 Other seborrheic keratosis: Secondary | ICD-10-CM

## 2022-01-21 DIAGNOSIS — L813 Cafe au lait spots: Secondary | ICD-10-CM

## 2022-01-21 DIAGNOSIS — Z1283 Encounter for screening for malignant neoplasm of skin: Secondary | ICD-10-CM

## 2022-01-21 DIAGNOSIS — Z86018 Personal history of other benign neoplasm: Secondary | ICD-10-CM

## 2022-01-21 DIAGNOSIS — L820 Inflamed seborrheic keratosis: Secondary | ICD-10-CM | POA: Diagnosis not present

## 2022-01-21 DIAGNOSIS — D1800 Hemangioma unspecified site: Secondary | ICD-10-CM

## 2022-01-21 DIAGNOSIS — L814 Other melanin hyperpigmentation: Secondary | ICD-10-CM

## 2022-01-21 DIAGNOSIS — D492 Neoplasm of unspecified behavior of bone, soft tissue, and skin: Secondary | ICD-10-CM

## 2022-01-21 NOTE — Progress Notes (Signed)
New Patient Visit  Subjective  Jessica Thornton is a 56 y.o. female who presents for the following: Annual Exam (Here for skin cancer screening. Full body. No hx of skin cancer. Hx of DN.).  The patient presents for Total-Body Skin Exam (TBSE) for skin cancer screening and mole check.  The patient has spots, moles and lesions to be evaluated, some may be new or changing and the patient has concerns that these could be cancer.  Review of Systems: No other skin or systemic complaints except as noted in HPI or Assessment and Plan.   Objective  Well appearing patient in no apparent distress; mood and affect are within normal limits.  A full examination was performed including scalp, head, eyes, ears, nose, lips, neck, chest, axillae, abdomen, back, buttocks, bilateral upper extremities, bilateral lower extremities, hands, feet, fingers, toes, fingernails, and toenails. All findings within normal limits unless otherwise noted below.  Right Upper Back lateral 0.3cm darker brown thin papule clover shaped with perifollicular dropout   Mid Upper Back 6.0 cm light to medium brown speckled patch       Right Chest Erythematous keratotic or waxy stuck-on papule or plaque.  Right medial chest 0.5 cm pink papule      right cheek Small superficial broken capillaries, blanching and without features suspicious for malignancy on dermoscopy    Assessment & Plan   Lentigines - Scattered tan macules - Due to sun exposure - Benign-appearing, observe - Recommend daily broad spectrum sunscreen SPF 30+ to sun-exposed areas, reapply every 2 hours as needed. - Call for any changes  Seborrheic Keratoses - Stuck-on, waxy, tan-brown papules and/or plaques  - Benign-appearing - Discussed benign etiology and prognosis. - Observe - Call for any changes  Melanocytic Nevi - Tan-brown and/or pink-flesh-colored symmetric macules and papules - Benign appearing on exam today - Observation -  Call clinic for new or changing moles - Recommend daily use of broad spectrum spf 30+ sunscreen to sun-exposed areas.   Hemangiomas - Red papules - Discussed benign nature - Observe - Call for any changes  Actinic Damage - Chronic condition, secondary to cumulative UV/sun exposure - diffuse scaly erythematous macules with underlying dyspigmentation - Recommend daily broad spectrum sunscreen SPF 30+ to sun-exposed areas, reapply every 2 hours as needed.  - Staying in the shade or wearing long sleeves, sun glasses (UVA+UVB protection) and wide brim hats (4-inch brim around the entire circumference of the hat) are also recommended for sun protection.  - Call for new or changing lesions.  Dermatofibroma - Firm pink/brown papulenodule with dimple sign at right upper arm - Benign appearing - Call for any changes   Skin cancer screening performed today.  Nevus (2) Right Upper Back lateral; Mid Upper Back  Mid upper back c/w agminated nevus  Both benign-appearing.  Observation.  Call clinic for new or changing lesions.  Recommend daily use of broad spectrum spf 30+ sunscreen to sun-exposed areas.    Inflamed seborrheic keratosis Right Chest  Patient deferred treatment at this time.   Neoplasm of skin Right medial chest  Skin / nail biopsy Type of biopsy: tangential   Informed consent: discussed and consent obtained   Anesthesia: the lesion was anesthetized in a standard fashion   Anesthesia comment:  Area prepped with alcohol Anesthetic:  1% lidocaine w/ epinephrine 1-100,000 buffered w/ 8.4% NaHCO3 Instrument used: flexible razor blade   Hemostasis achieved with: pressure, aluminum chloride and electrodesiccation   Outcome: patient tolerated procedure well  Post-procedure details: wound care instructions given   Post-procedure details comment:  Ointment and small bandage applied  Specimen 1 - Surgical pathology Differential Diagnosis: R/O ISK vs verruca plana vs  BCC   Check Margins: No  Telangiectasia right cheek  - Dilated blood vessel - Benign appearing on exam - Call for changes    Return in about 1 year (around 01/21/2023) for TBSE.  I, Emelia Salisbury, CMA, am acting as scribe for Forest Gleason, MD.  Documentation: I have reviewed the above documentation for accuracy and completeness, and I agree with the above.  Forest Gleason, MD

## 2022-01-21 NOTE — Patient Instructions (Addendum)
Wound Care Instructions  Cleanse wound gently with soap and water once a day then pat dry with clean gauze. Apply a thing coat of Petrolatum (petroleum jelly, "Vaseline") over the wound (unless you have an allergy to this). We recommend that you use a new, sterile tube of Vaseline. Do not pick or remove scabs. Do not remove the yellow or white "healing tissue" from the base of the wound.  Cover the wound with fresh, clean, nonstick gauze and secure with paper tape. You may use Band-Aids in place of gauze and tape if the would is small enough, but would recommend trimming much of the tape off as there is often too much. Sometimes Band-Aids can irritate the skin.  You should call the office for your biopsy report after 1 week if you have not already been contacted.  If you experience any problems, such as abnormal amounts of bleeding, swelling, significant bruising, significant pain, or evidence of infection, please call the office immediately.  FOR ADULT SURGERY PATIENTS: If you need something for pain relief you may take 1 extra strength Tylenol (acetaminophen) AND 2 Ibuprofen (200mg  each) together every 4 hours as needed for pain. (do not take these if you are allergic to them or if you have a reason you should not take them.) Typically, you may only need pain medication for 1 to 3 days.     Recommend taking Heliocare sun protection supplement daily in sunny weather for additional sun protection. For maximum protection on the sunniest days, you can take up to 2 capsules of regular Heliocare OR take 1 capsule of Heliocare Ultra. For prolonged exposure (such as a full day in the sun), you can repeat your dose of the supplement 4 hours after your first dose. Heliocare can be purchased at [employer], at some [facility] or at [email or web address].     Some Recommended Sunscreens Include:  Good for Daily Wear (feels like lotion but NOT sweat resistant) Cerave AM Moisturizer with SPF EltaMD UV  Lotion  Body or All Over Sunscreen EltaMD UV active for body and face Blue lizard sensitive Sun bum mineral (avoid if sensitive to scent) Aveeno Positively Mineral Neutrogena sheer zinc (Slightly harder to rub in) [facility] clear zinc (Slightly harder to rub in)  Clear Face Sunscreen EltaMD UV Elements CeraVe hydrating sunscreen 50 face  Tinted Face Sunscreen Alastin Hydratint (good for most skin tones, may be slightly dark if you are very fair) Colorescience Sunforgettable Total Protection Face Shield (good for most skin tones) EltaMD UV Physical La Roche Posay Mineral Tinted Cotz Flawless Complexion   Powder Sunscreen (Nice for reapplying or applying on the go) Colorescience Sunforgettable Total Protection Brush on Shield (available in different tints)  Face Sunscreen Available in Different Tints Colorescience Sunforgettable Total Protection Brush on Shield  bareMinerals Complexion Rescue Tinted Hydrating Gel Cream Broad Spectrum SPF 30 UnSun mineral tinted (comes in medium/dark and light/medium)  Kids (over 6 months) - Mineral Sunscreens Recommended eltaMD UV Pure MDsolarSciences KidStick 40 SPF Aveeno Baby Continuous Protection Sensitive Zinc Oxide Blue Lizard Kids mineral based sunscreen lotion Mustela Mineral Sunscreen for face and body Neutrogena Sheer Zinc Kids Sunscreen Stick  Tinted to look like a tan PCAskin sheer tint body spray   Melanoma ABCDEs  Melanoma is the most dangerous type of skin cancer, and is the leading cause of death from skin disease.  You are more likely to develop melanoma if you: Have light-colored skin, light-colored eyes, or red or blond hair Spend a lot  of time in the sun Tan regularly, either outdoors or in a tanning bed Have had blistering sunburns, especially during childhood Have a close family member who has had a melanoma Have atypical moles or large birthmarks  Early detection of melanoma is key since treatment is typically  straightforward and cure rates are extremely high if we catch it early.   The first sign of melanoma is often a change in a mole or a new dark spot.  The ABCDE system is a way of remembering the signs of melanoma.  A for asymmetry:  The two halves do not match. B for border:  The edges of the growth are irregular. C for color:  A mixture of colors are present instead of an even brown color. D for diameter:  Melanomas are usually (but not always) greater than 23mm - the size of a pencil eraser. E for evolution:  The spot keeps changing in size, shape, and color.  Please check your skin once per month between visits. You can use a small mirror in front and a large mirror behind you to keep an eye on the back side or your body.   If you see any new or changing lesions before your next follow-up, please call to schedule a visit.  Please continue daily skin protection including broad spectrum sunscreen SPF 30+ to sun-exposed areas, reapplying every 2 hours as needed when you're outdoors.   Staying in the shade or wearing long sleeves, sun glasses (UVA+UVB protection) and wide brim hats (4-inch brim around the entire circumference of the hat) are also recommended for sun protection.    If You Need Anything After Your Visit  If you have any questions or concerns for your doctor, please call our main line at [phone] and press option 4 to reach your doctor's medical assistant. If no one answers, please leave a voicemail as directed and we will return your call as soon as possible. Messages left after 4 pm will be answered the following business day.   You may also send [place] a message via [place]. We typically respond to MyChart messages within 1-2 business days.  For prescription refills, please ask your pharmacy to contact our office. Our fax number is [phone].  If you have an urgent issue when the clinic is closed that cannot wait until the next business day, you can page your doctor at the  number below.    Please note that while we do our best to be available for urgent issues outside of office hours, we are not available 24/7.   If you have an urgent issue and are unable to reach [place], you may choose to seek medical care at your doctor's office, retail clinic, urgent care center, or emergency room.  If you have a medical emergency, please immediately call 911 or go to the emergency department.  Pager Numbers  - Dr. Nehemiah Massed: [phone]  - Dr. Laurence Ferrari: [phone]  - Dr. Nicole Kindred: [phone]  In the event of inclement weather, please call our main line at [phone] for an update on the status of any delays or closures.  Dermatology Medication Tips: Please keep the boxes that topical medications come in in order to help keep track of the instructions about where and how to use these. Pharmacies typically print the medication instructions only on the boxes and not directly on the medication tubes.   If your medication is too expensive, please contact our office at [phone] option 4 or send [place] a message  through [place].   We are unable to tell what your co-pay for medications will be in advance as this is different depending on your insurance coverage. However, we may be able to find a substitute medication at lower cost or fill out paperwork to get insurance to cover a needed medication.   If a prior authorization is required to get your medication covered by your insurance company, please allow [place] 1-2 business days to complete this process.  Drug prices often vary depending on where the prescription is filled and some pharmacies may offer cheaper prices.  The website www.goodrx.com contains coupons for medications through different pharmacies. The prices here do not account for what the cost may be with help from insurance (it may be cheaper with your insurance), but the website can give you the price if you did not use any insurance.  - You can print the associated  coupon and take it with your prescription to the pharmacy.  - You may also stop by our office during regular business hours and pick up a GoodRx coupon card.  - If you need your prescription sent electronically to a different pharmacy, notify our office through [facility] or by phone at [phone] option 4.     Si Usted Necesita Algo Despus de Su Visita  Tambin puede enviarnos un mensaje a travs de Pharmacist, community. Por lo general respondemos a los mensajes de MyChart en el transcurso de 1 a 2 das hbiles.  Para renovar recetas, por favor pida a su farmacia que se ponga en contacto con nuestra oficina. Harland Dingwall de fax es [place] [phone].  Si tiene un asunto urgente cuando la clnica est cerrada y que no puede esperar hasta el siguiente da hbil, puede llamar/localizar a su doctor(a) al nmero que aparece a continuacin.   Por favor, tenga en cuenta que aunque hacemos todo lo posible para estar disponibles para asuntos urgentes fuera del horario de [place], no estamos disponibles las 24 horas del da, los 7 das de la [place].   Si tiene un problema urgente y no puede comunicarse con nosotros, puede optar por buscar atencin mdica  en el consultorio de su doctor(a), en una clnica privada, en un centro de atencin urgente o en una sala de emergencias.  Si tiene Engineering geologist, por favor llame inmediatamente al 911 o vaya a la sala de emergencias.  Nmeros de bper  - Dr. Nehemiah Massed: [phone]  - Dra. Moye: [phone]  - Dra. Nicole Kindred: [phone]  En caso de inclemencias del [place], por favor llame a Johnsie Kindred principal al [phone] para una actualizacin sobre el [place] de cualquier retraso o cierre.  Consejos para la medicacin en dermatologa: Por favor, guarde las cajas en las que vienen los medicamentos de uso tpico para ayudarle a seguir las instrucciones sobre dnde y cmo usarlos. Las farmacias generalmente imprimen las instrucciones del  medicamento slo en las cajas y no directamente en los tubos del [place].   Si su medicamento es muy caro, por favor, pngase en contacto con Zigmund Daniel llamando al [phone] y presione la opcin 4 o envenos un mensaje a travs de Pharmacist, community.   No podemos decirle cul ser su copago por los medicamentos por adelantado ya que esto es diferente dependiendo de la cobertura de su seguro. Sin embargo, es posible que podamos encontrar un medicamento sustituto a Electrical engineer un formulario para que el seguro cubra el medicamento que se considera necesario.   Si se requiere Solicitor  previa para que su compaa de seguros [place]union su medicamento, por favor permtanos de 1 a 2 das hbiles para completar este proceso.  Los precios de los medicamentos varan con frecuencia dependiendo del Environmental consultant de dnde se surte la receta y alguna farmacias pueden ofrecer precios ms baratos.  El sitio web www.goodrx.com tiene cupones para medicamentos de Airline pilot. Los precios aqu no tienen en cuenta lo que podra costar con la ayuda del seguro (puede ser ms barato con su seguro), pero el sitio web puede darle el precio si no utiliz Research scientist (physical sciences).  - Puede imprimir el cupn correspondiente y llevarlo con su receta a la farmacia.  - Tambin puede pasar por nuestra oficina durante el horario de atencin regular y Charity fundraiser una tarjeta de cupones de GoodRx.  - Si necesita que su receta se enve electrnicamente a una farmacia diferente, informe a nuestra oficina a travs de MyChart de [place] o por telfono llamando al [phone] y presione la opcin 4.

## 2022-01-22 ENCOUNTER — Encounter: Payer: Self-pay | Admitting: Dermatology

## 2022-01-26 ENCOUNTER — Telehealth: Payer: Self-pay

## 2022-01-26 NOTE — Telephone Encounter (Addendum)
Called patient and discussed biopsy results. Patient would prefer LN2 treatment in clinic. Patient scheduled for February 28 to have area treated.  Patient denies further questions at this time.   ----- Message from Alfonso Patten, MD sent at 01/25/2022 11:11 PM EST ----- Skin , right medial chest LICHENOID ACTINIC KERATOSIS, PERIPHERAL MARGIN INVOLVED --> LN2 in clinic in the next 3-4 months vs topical therapy with 5-FU/calcipotriene twice a day x 7 days once biopsy site completely healed  MAs please call. Thank you!

## 2022-01-28 IMAGING — MG MM DIGITAL DIAGNOSTIC UNILAT*R* W/ TOMO W/ CAD
6 series · 6 of 18 positions shown · non-contrast
Comparison: Previous exam(s).

CLINICAL DATA: 54-year-old female for further evaluation of
possible RIGHT breast asymmetry on screening mammogram.

EXAM:
DIGITAL DIAGNOSTIC UNILATERAL RIGHT MAMMOGRAM WITH TOMOSYNTHESIS AND
CAD
TECHNIQUE: Right digital diagnostic mammography and breast tomosynthesis was
performed. The images were evaluated with computer-aided detection.

[R MLO synth-2D]
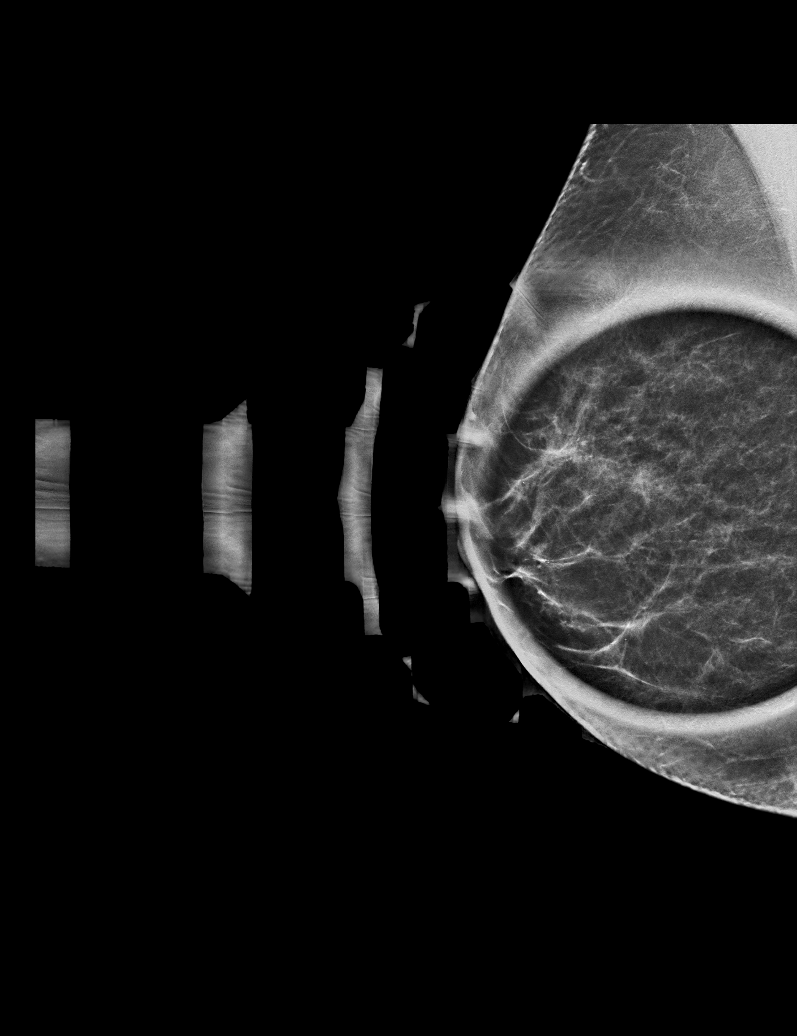

[R ML synth-2D]
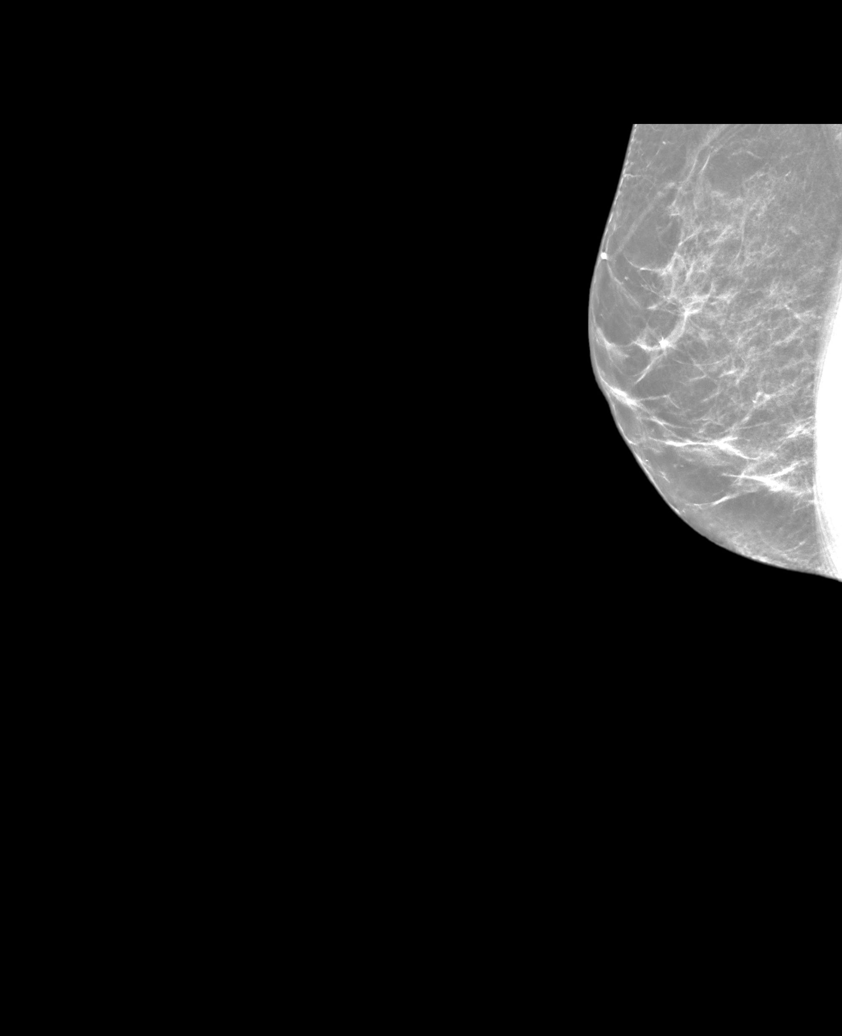

[R CC synth-2D]
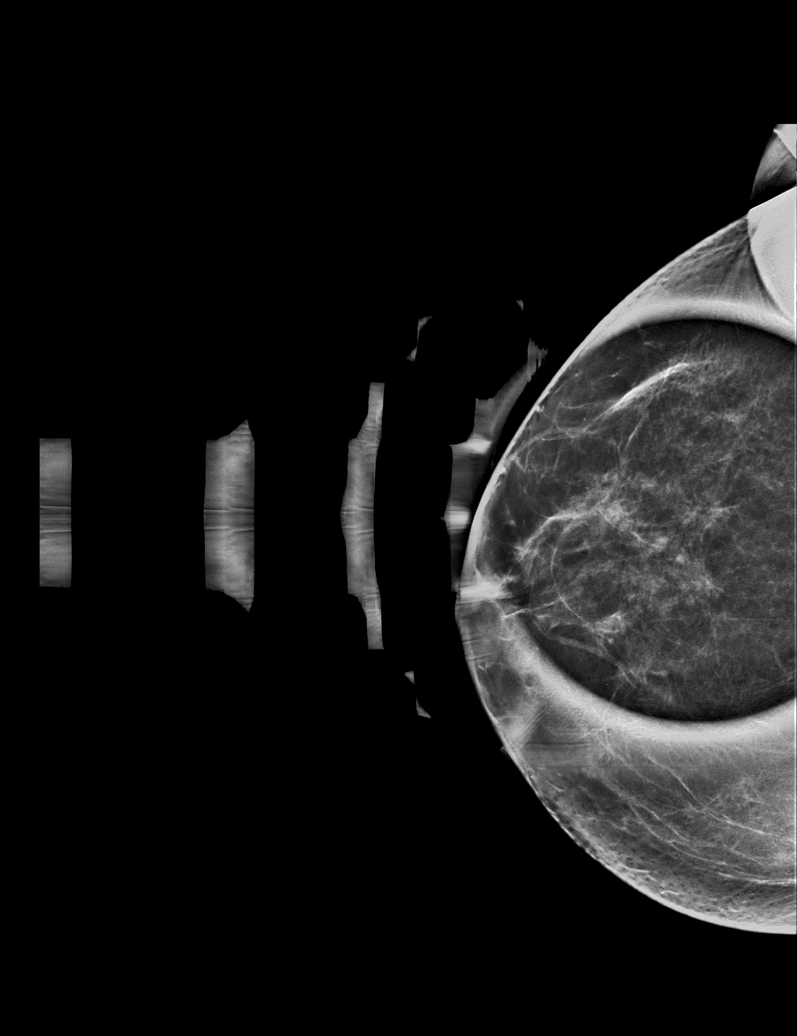

[R MLO tomo · tomo slice 27/52.0]
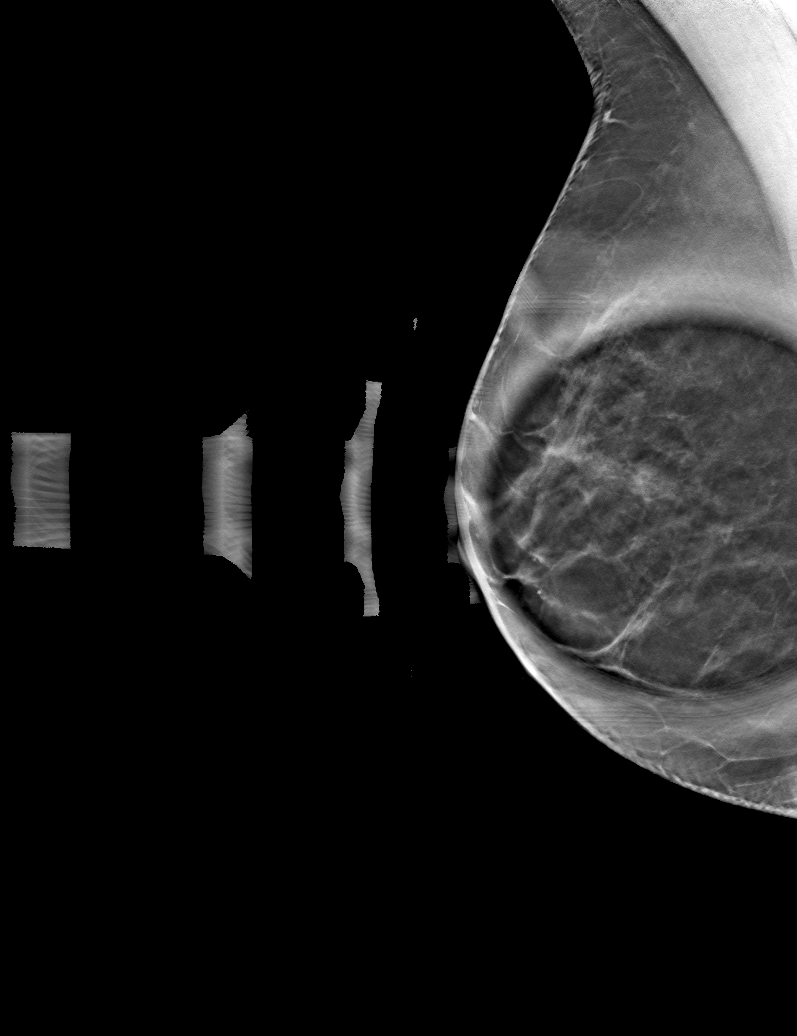

[R CC tomo · tomo slice 27/53.0]
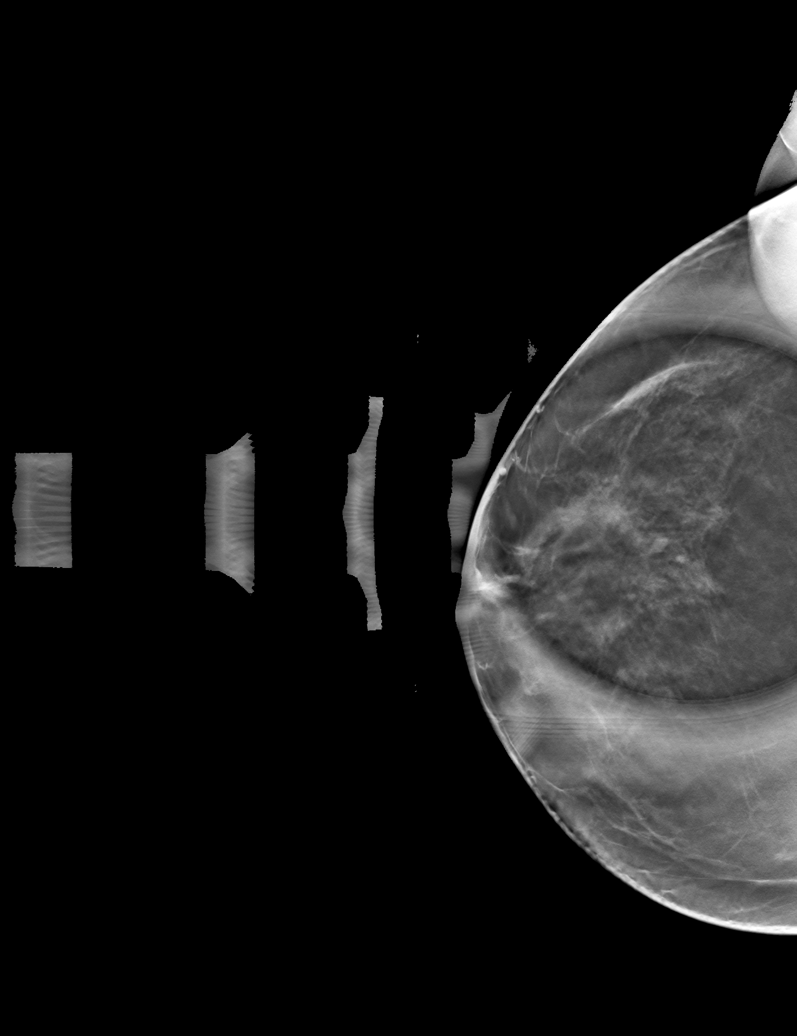

[R ML tomo · tomo slice 31/61.0]
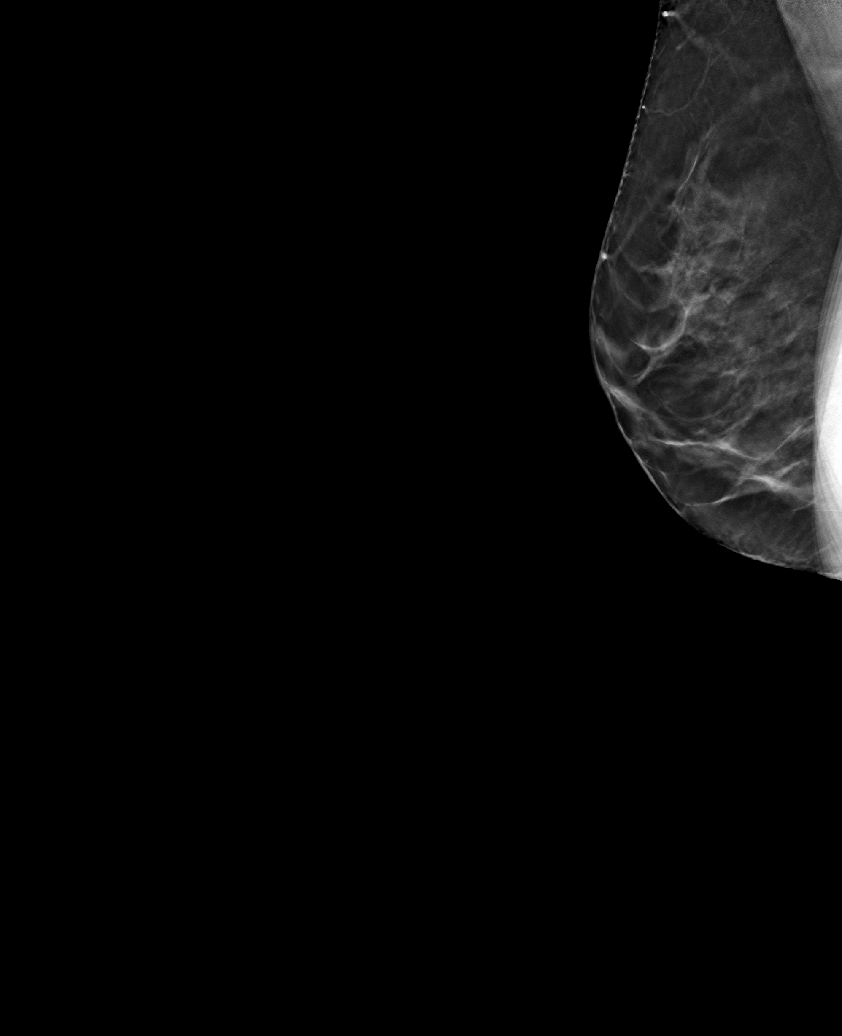

[6 of 18 positions shown; findings below may reference images not displayed]

ACR Breast Density Category b: There are scattered areas of
fibroglandular density.
FINDINGS: 2D/3D full field and spot compression views of the RIGHT breast
demonstrate no persistent abnormality at the site of the screening
study finding.
IMPRESSION: No persistent abnormality at the site of the screening study
finding.

RECOMMENDATION:
Bilateral screening mammogram in 1 year.

I have discussed the findings and recommendations with the patient.
If applicable, a reminder letter will be sent to the patient
regarding the next appointment.

BI-RADS CATEGORY  1: Negative.

## 2022-02-23 ENCOUNTER — Encounter: Payer: Self-pay | Admitting: Dermatology

## 2022-02-23 ENCOUNTER — Ambulatory Visit (INDEPENDENT_AMBULATORY_CARE_PROVIDER_SITE_OTHER): Payer: 59 | Admitting: Dermatology

## 2022-02-23 ENCOUNTER — Other Ambulatory Visit: Payer: Self-pay

## 2022-02-23 DIAGNOSIS — L570 Actinic keratosis: Secondary | ICD-10-CM | POA: Diagnosis not present

## 2022-02-23 NOTE — Progress Notes (Signed)
° °  Follow-Up Visit   Subjective  Jessica Thornton is a 56 y.o. female who presents for the following: Actinic Keratosis (Here to treat biopsy proven AK on right chest).  The patient has spots, moles and lesions to be evaluated, some may be new or changing and the patient has concerns that these could be cancer.  The following portions of the chart were reviewed this encounter and updated as appropriate:  Tobacco   Allergies   Meds   Problems   Med Hx   Surg Hx   Fam Hx       Review of Systems: No other skin or systemic complaints except as noted in HPI or Assessment and Plan.   Objective  Well appearing patient in no apparent distress; mood and affect are within normal limits.  A focused examination was performed including chest. Relevant physical exam findings are noted in the Assessment and Plan.  Right Medial Breast x1 Erythematous thin papules/macules with gritty scale.    Assessment & Plan   AK (actinic keratosis) Right Medial Breast x1  Biopsy proven  Actinic keratoses are precancerous spots that appear secondary to cumulative UV radiation exposure/sun exposure over time. They are chronic with expected duration over 1 year. A portion of actinic keratoses will progress to squamous cell carcinoma of the skin. It is not possible to reliably predict which spots will progress to skin cancer and so treatment is recommended to prevent development of skin cancer.  Recommend daily broad spectrum sunscreen SPF 30+ to sun-exposed areas, reapply every 2 hours as needed.  Recommend staying in the shade or wearing long sleeves, sun glasses (UVA+UVB protection) and wide brim hats (4-inch brim around the entire circumference of the hat). Call for new or changing lesions.  Prior to procedure, discussed risks of blister formation, small wound, skin dyspigmentation, or rare scar following cryotherapy. Recommend Vaseline ointment to treated areas while healing.   Destruction of lesion -  Right Medial Breast x1  Destruction method: cryotherapy   Informed consent: discussed and consent obtained   Lesion destroyed using liquid nitrogen: Yes   Outcome: patient tolerated procedure well with no complications   Post-procedure details: wound care instructions given     Return for TBSE As Scheduled.  I, Emelia Salisbury, CMA, am acting as scribe for Forest Gleason, MD.  Documentation: I have reviewed the above documentation for accuracy and completeness, and I agree with the above.  Forest Gleason, MD

## 2022-02-23 NOTE — Patient Instructions (Signed)
Cryotherapy Aftercare  Wash gently with soap and water everyday.   Apply Vaseline and Band-Aid daily until healed.   Prior to procedure, discussed risks of blister formation, small wound, skin dyspigmentation, or rare scar following cryotherapy. Recommend Vaseline ointment to treated areas while healing.      If You Need Anything After Your Visit  If you have any questions or concerns for your doctor, please call our main line at [phone] and press option 4 to reach your doctor's medical assistant. If no one answers, please leave a voicemail as directed and we will return your call as soon as possible. Messages left after 4 pm will be answered the following business day.   You may also send [place] a message via [place]. We typically respond to MyChart messages within 1-2 business days.  For prescription refills, please ask your pharmacy to contact our office. Our fax number is [phone].  If you have an urgent issue when the clinic is closed that cannot wait until the next business day, you can page your doctor at the number below.    Please note that while we do our best to be available for urgent issues outside of office hours, we are not available 24/7.   If you have an urgent issue and are unable to reach [place], you may choose to seek medical care at your doctor's office, retail clinic, urgent care center, or emergency room.  If you have a medical emergency, please immediately call 911 or go to the emergency department.  Pager Numbers  - Dr. Nehemiah Massed: [phone]  - Dr. Laurence Ferrari: [phone]  - Dr. Nicole Kindred: [phone]  In the event of inclement weather, please call our main line at [phone] for an update on the status of any delays or closures.  Dermatology Medication Tips: Please keep the boxes that topical medications come in in order to help keep track of the instructions about where and how to use these. Pharmacies typically print the medication instructions only on  the boxes and not directly on the medication tubes.   If your medication is too expensive, please contact our office at [phone] option 4 or send [place] a message through [place].   We are unable to tell what your co-pay for medications will be in advance as this is different depending on your insurance coverage. However, we may be able to find a substitute medication at lower cost or fill out paperwork to get insurance to cover a needed medication.   If a prior authorization is required to get your medication covered by your insurance company, please allow [place] 1-2 business days to complete this process.  Drug prices often vary depending on where the prescription is filled and some pharmacies may offer cheaper prices.  The website www.goodrx.com contains coupons for medications through different pharmacies. The prices here do not account for what the cost may be with help from insurance (it may be cheaper with your insurance), but the website can give you the price if you did not use any insurance.  - You can print the associated coupon and take it with your prescription to the pharmacy.  - You may also stop by our office during regular business hours and pick up a GoodRx coupon card.  - If you need your prescription sent electronically to a different pharmacy, notify our office through [facility] or by phone at [phone] option 4.     Si Usted Necesita Algo Despus de Su Visita  Tambin puede enviarnos un  mensaje a travs de MyChart. Por lo general respondemos a los mensajes de MyChart en el transcurso de 1 a 2 das hbiles.  Para renovar recetas, por favor pida a su farmacia que se ponga en contacto con nuestra oficina. Harland Dingwall de fax es [place] [phone].  Si tiene un asunto urgente cuando la clnica est cerrada y que no puede esperar hasta el siguiente da hbil, puede llamar/localizar a su doctor(a) al nmero que aparece a continuacin.   Por favor, tenga en cuenta que  aunque hacemos todo lo posible para estar disponibles para asuntos urgentes fuera del horario de [place], no estamos disponibles las 24 horas del da, los 7 das de la [place].   Si tiene un problema urgente y no puede comunicarse con nosotros, puede optar por buscar atencin mdica  en el consultorio de su doctor(a), en una clnica privada, en un centro de atencin urgente o en una sala de emergencias.  Si tiene Engineering geologist, por favor llame inmediatamente al 911 o vaya a la sala de emergencias.  Nmeros de bper  - Dr. Nehemiah Massed: [phone]  - Dra. Moye: [phone]  - Dra. Nicole Kindred: [phone]  En caso de inclemencias del [place], por favor llame a Johnsie Kindred principal al [phone] para una actualizacin sobre el [place] de cualquier retraso o cierre.  Consejos para la medicacin en dermatologa: Por favor, guarde las cajas en las que vienen los medicamentos de uso tpico para ayudarle a seguir las instrucciones sobre dnde y cmo usarlos. Las farmacias generalmente imprimen las instrucciones del medicamento slo en las cajas y no directamente en los tubos del [place].   Si su medicamento es muy caro, por favor, pngase en contacto con Zigmund Daniel llamando al [phone] y presione la opcin 4 o envenos un mensaje a travs de Pharmacist, community.   No podemos decirle cul ser su copago por los medicamentos por adelantado ya que esto es diferente dependiendo de la cobertura de su seguro. Sin embargo, es posible que podamos encontrar un medicamento sustituto a Electrical engineer un formulario para que el seguro cubra el medicamento que se considera necesario.   Si se requiere una autorizacin previa para que su compaa de seguros [place] su medicamento, por favor permtanos de 1 a 2 das hbiles para completar este proceso.  Los precios de los medicamentos varan con frecuencia dependiendo del Environmental consultant de dnde se surte la receta y alguna farmacias pueden ofrecer precios ms  baratos.  El sitio web www.goodrx.com tiene cupones para medicamentos de Airline pilot. Los precios aqu no tienen en cuenta lo que podra costar con la ayuda del seguro (puede ser ms barato con su seguro), pero el sitio web puede darle el precio si no utiliz Research scientist (physical sciences).  - Puede imprimir el cupn correspondiente y llevarlo con su receta a la farmacia.  - Tambin puede pasar por nuestra oficina durante el horario de atencin regular y Charity fundraiser una tarjeta de cupones de GoodRx.  - Si necesita que su receta se enve electrnicamente a una farmacia diferente, informe a nuestra oficina a travs de MyChart de [place] o por telfono llamando al [phone] y presione la opcin 4.

## 2022-05-26 ENCOUNTER — Other Ambulatory Visit: Payer: Self-pay | Admitting: Obstetrics and Gynecology

## 2022-05-26 DIAGNOSIS — Z1231 Encounter for screening mammogram for malignant neoplasm of breast: Secondary | ICD-10-CM

## 2022-06-18 ENCOUNTER — Ambulatory Visit
Admission: RE | Admit: 2022-06-18 | Discharge: 2022-06-18 | Disposition: A | Discharge status: Home / Self Care | Payer: 59 | Source: Ambulatory Visit | Attending: Obstetrics and Gynecology | Admitting: Obstetrics and Gynecology

## 2022-06-18 DIAGNOSIS — Z1231 Encounter for screening mammogram for malignant neoplasm of breast: Secondary | ICD-10-CM | POA: Insufficient documentation

## 2022-06-25 LAB — CBC AND DIFFERENTIAL
HCT: 41 (ref 36–46)
Hemoglobin: 13.8 (ref 12.0–16.0)
WBC: 6.5

## 2022-06-25 LAB — LIPID PANEL
Cholesterol: 226 — AB (ref 0–200)
HDL: 64 (ref 35–70)
LDL Cholesterol: 123
Triglycerides: 224 — AB (ref 40–160)

## 2022-07-13 ENCOUNTER — Ambulatory Visit (INDEPENDENT_AMBULATORY_CARE_PROVIDER_SITE_OTHER): Payer: 59 | Admitting: Dermatology

## 2022-07-13 DIAGNOSIS — L821 Other seborrheic keratosis: Secondary | ICD-10-CM

## 2022-07-13 DIAGNOSIS — R21 Rash and other nonspecific skin eruption: Secondary | ICD-10-CM | POA: Diagnosis not present

## 2022-07-13 DIAGNOSIS — L820 Inflamed seborrheic keratosis: Secondary | ICD-10-CM | POA: Diagnosis not present

## 2022-07-13 MED ORDER — TRIAMCINOLONE ACETONIDE 0.1 % EX CREA: TOPICAL_CREAM | CUTANEOUS | 0 refills | Status: DC

## 2022-07-13 NOTE — Patient Instructions (Addendum)
Cryotherapy Aftercare  Wash gently with soap and water everyday.   Apply Vaseline and Band-Aid daily until healed.     Due to recent changes in healthcare laws, you may see results of your pathology and/or laboratory studies on MyChart before the doctors have had a chance to review them. We understand that in some cases there may be results that are confusing or concerning to you. Please understand that not all results are received at the same time and often the doctors may need to interpret multiple results in order to provide you with the best plan of care or course of treatment. Therefore, we ask that you please give us 2 business days to thoroughly review all your results before contacting the office for clarification. Should we see a critical lab result, you will be contacted sooner.   If You Need Anything After Your Visit  If you have any questions or concerns for your doctor, please call our main line at [phone] and press option 4 to reach your doctor's medical assistant. If no one answers, please leave a voicemail as directed and we will return your call as soon as possible. Messages left after 4 pm will be answered the following business day.   You may also send us a message via MyChart. We typically respond to MyChart messages within 1-2 business days.  For prescription refills, please ask your pharmacy to contact our office. Our fax number is [phone].  If you have an urgent issue when the clinic is closed that cannot wait until the next business day, you can page your doctor at the number below.    Please note that while we do our best to be available for urgent issues outside of office hours, we are not available 24/7.   If you have an urgent issue and are unable to reach us, you may choose to seek medical care at your doctor's office, retail clinic, urgent care center, or emergency room.  If you have a medical emergency, please immediately call 911 or go to the  emergency department.  Pager Numbers  - Dr. Kowalski: [phone]  - Dr. Moye: [phone]  - Dr. Stewart: [phone]  In the event of inclement weather, please call our main line at [phone] for an update on the status of any delays or closures.  Dermatology Medication Tips: Please keep the boxes that topical medications come in in order to help keep track of the instructions about where and how to use these. Pharmacies typically print the medication instructions only on the boxes and not directly on the medication tubes.   If your medication is too expensive, please contact our office at [phone] option 4 or send us a message through MyChart.   We are unable to tell what your co-pay for medications will be in advance as this is different depending on your insurance coverage. However, we may be able to find a substitute medication at lower cost or fill out paperwork to get insurance to cover a needed medication.   If a prior authorization is required to get your medication covered by your insurance company, please allow us 1-2 business days to complete this process.  Drug prices often vary depending on where the prescription is filled and some pharmacies may offer cheaper prices.  The website www.goodrx.com contains coupons for medications through different pharmacies. The prices here do not account for what the cost may be with help from insurance (it may be cheaper with your insurance), but the website can   give you the price if you did not use any insurance.  - You can print the associated coupon and take it with your prescription to the pharmacy.  - You may also stop by our office during regular business hours and pick up a GoodRx coupon card.  - If you need your prescription sent electronically to a different pharmacy, notify our office through [facility] MyChart or by phone at [phone] option 4.     Si Usted Necesita Algo Despus de Su Visita  Tambin puede  enviarnos un mensaje a travs de MyChart. Por lo general respondemos a los mensajes de MyChart en el transcurso de 1 a 2 das hbiles.  Para renovar recetas, por favor pida a su farmacia que se ponga en contacto con nuestra oficina. Nuestro nmero de fax es el [phone].  Si tiene un asunto urgente cuando la clnica est cerrada y que no puede esperar hasta el siguiente da hbil, puede llamar/localizar a su doctor(a) al nmero que aparece a continuacin.   Por favor, tenga en cuenta que aunque hacemos todo lo posible para estar disponibles para asuntos urgentes fuera del horario de oficina, no estamos disponibles las 24 horas del da, los 7 das de la semana.   Si tiene un problema urgente y no puede comunicarse con nosotros, puede optar por buscar atencin mdica  en el consultorio de su doctor(a), en una clnica privada, en un centro de atencin urgente o en una sala de emergencias.  Si tiene una emergencia mdica, por favor llame inmediatamente al 911 o vaya a la sala de emergencias.  Nmeros de bper  - Dr. Kowalski: [phone]  - Dra. Moye: [phone]  - Dra. Stewart: [phone]  En caso de inclemencias del tiempo, por favor llame a nuestra lnea principal al [phone] para una actualizacin sobre el estado de cualquier retraso o cierre.  Consejos para la medicacin en dermatologa: Por favor, guarde las cajas en las que vienen los medicamentos de uso tpico para ayudarle a seguir las instrucciones sobre dnde y cmo usarlos. Las farmacias generalmente imprimen las instrucciones del medicamento slo en las cajas y no directamente en los tubos del medicamento.   Si su medicamento es muy caro, por favor, pngase en contacto con nuestra oficina llamando al [phone] y presione la opcin 4 o envenos un mensaje a travs de MyChart.   No podemos decirle cul ser su copago por los medicamentos por adelantado ya que esto es diferente dependiendo de la cobertura de su seguro.  Sin embargo, es posible que podamos encontrar un medicamento sustituto a menor costo o llenar un formulario para que el seguro cubra el medicamento que se considera necesario.   Si se requiere una autorizacin previa para que su compaa de seguros cubra su medicamento, por favor permtanos de 1 a 2 das hbiles para completar este proceso.  Los precios de los medicamentos varan con frecuencia dependiendo del lugar de dnde se surte la receta y alguna farmacias pueden ofrecer precios ms baratos.  El sitio web www.goodrx.com tiene cupones para medicamentos de diferentes farmacias. Los precios aqu no tienen en cuenta lo que podra costar con la ayuda del seguro (puede ser ms barato con su seguro), pero el sitio web puede darle el precio si no utiliz ningn seguro.  - Puede imprimir el cupn correspondiente y llevarlo con su receta a la farmacia.  - Tambin puede pasar por nuestra oficina durante el horario de atencin regular y recoger una tarjeta de cupones de GoodRx.  -   Si necesita que su receta se enve electrnicamente a una farmacia diferente, informe a nuestra oficina a travs de MyChart de [facility] o por telfono llamando al [phone] y presione la opcin 4.  

## 2022-07-13 NOTE — Progress Notes (Signed)
   Follow-Up Visit   Subjective  Jessica Thornton is a 56 y.o. female who presents for the following: Skin Problem (The patient has spots, moles and lesions to be evaluated, some may be new or changing. ).   The following portions of the chart were reviewed this encounter and updated as appropriate:   Tobacco  Allergies  Meds  Problems  Med Hx  Surg Hx  Fam Hx      Review of Systems:  No other skin or systemic complaints except as noted in HPI or Assessment and Plan.  Objective  Well appearing patient in no apparent distress; mood and affect are within normal limits.  A focused examination was performed including face,chest. Relevant physical exam findings are noted in the Assessment and Plan.  right chest Stuck-on, waxy, tan-brown papule or plaque --Discussed benign etiology and prognosis.   left chest Erythematous papules    Assessment & Plan  Inflamed seborrheic keratosis right chest  Symptomatic, irritating, patient would like treated.   Prior to procedure, discussed risks of blister formation, small wound, skin dyspigmentation, or rare scar following cryotherapy. Recommend Vaseline ointment to treated areas while healing.   Destruction of lesion - right chest Complexity: simple   Destruction method: cryotherapy   Informed consent: discussed and consent obtained   Timeout:  patient name, date of birth, surgical site, and procedure verified Lesion destroyed using liquid nitrogen: Yes   Region frozen until ice ball extended beyond lesion: Yes   Outcome: patient tolerated procedure well with no complications   Post-procedure details: wound care instructions given    Rash and other nonspecific skin eruption left chest  Start Triamcinolone cream apply to affected skin bid x 2 weeks. Avoid applying to face, groin, and axilla. Use as directed. Long-term use can cause thinning of the skin.  If not gone in 3-4 weeks call and let us know, we may consider biopsy     Topical steroids (such as triamcinolone, fluocinolone, fluocinonide, mometasone, clobetasol, halobetasol, betamethasone, hydrocortisone) can cause thinning and lightening of the skin if they are used for too long in the same area. Your physician has selected the right strength medicine for your problem and area affected on the body. Please use your medication only as directed by your physician to prevent side effects.         Related Medications triamcinolone cream (KENALOG) 0.1 % Apply to affected skin bid x 2 weeks  Return if symptoms worsen or fail to improve.  I, Marye Round, CMA, am acting as scribe for Forest Gleason, MD .   Documentation: I have reviewed the above documentation for accuracy and completeness, and I agree with the above.  Forest Gleason, MD

## 2022-07-18 ENCOUNTER — Encounter: Payer: Self-pay | Admitting: Dermatology

## 2022-08-26 ENCOUNTER — Ambulatory Visit (INDEPENDENT_AMBULATORY_CARE_PROVIDER_SITE_OTHER): Payer: 59 | Admitting: Internal Medicine

## 2022-08-26 ENCOUNTER — Encounter: Payer: Self-pay | Admitting: Internal Medicine

## 2022-08-26 VITALS — BP 132/80 | HR 64 | Temp 98.1°F | Ht 65.5 in | Wt 155.4 lb

## 2022-08-26 DIAGNOSIS — K5790 Diverticulosis of intestine, part unspecified, without perforation or abscess without bleeding: Secondary | ICD-10-CM

## 2022-08-26 DIAGNOSIS — K5909 Other constipation: Secondary | ICD-10-CM

## 2022-08-26 DIAGNOSIS — E782 Mixed hyperlipidemia: Secondary | ICD-10-CM

## 2022-08-26 LAB — COMPREHENSIVE METABOLIC PANEL
ALT: 25 U/L (ref 0–35)
AST: 25 U/L (ref 0–37)
Albumin: 4.5 g/dL (ref 3.5–5.2)
Alkaline Phosphatase: 64 U/L (ref 39–117)
BUN: 15 mg/dL (ref 6–23)
CO2: 29 mEq/L (ref 19–32)
Calcium: 9.3 mg/dL (ref 8.4–10.5)
Chloride: 103 mEq/L (ref 96–112)
Creatinine, Ser: 0.68 mg/dL (ref 0.40–1.20)
GFR: 97.15 mL/min (ref >60.00)
Glucose, Bld: 84 mg/dL (ref 70–99)
Potassium: 4.3 mEq/L (ref 3.5–5.1)
Sodium: 140 mEq/L (ref 135–145)
Total Bilirubin: 0.6 mg/dL (ref 0.2–1.2)
Total Protein: 7.2 g/dL (ref 6.0–8.3)

## 2022-08-26 LAB — TSH: TSH: 0.8 u[IU]/mL (ref 0.35–5.50)

## 2022-08-26 MED ORDER — ZOSTER VAC RECOMB ADJUVANTED 50 MCG/0.5ML IM SUSR: 0.5000 mL | Freq: Once | INTRAMUSCULAR | 1 refills | Status: AC

## 2022-08-26 NOTE — Progress Notes (Signed)
Subjective:  Patient ID: Jessica Thornton, female    DOB: [birth year]  Age: 56 y.o. MRN: [id number]  CC: The primary encounter diagnosis was Moderate mixed hyperlipidemia not requiring statin therapy. Diagnoses of Diverticulosis and Chronic constipation were also pertinent to this visit.  HPI ALYSE KATHAN presents for  reestablisment  of care.  Last seen 8 years ago.    Had labs done by GYN recenlty and cholesterol was elevated (in June)   Normal P[facility] by [facility] June 2032 Normal mammogram June   Social:  Working with [employer],  helping mom take care of Dad who is declining (had a stroke 10 yrs ago)  and hospitalized for respiratory failure  and diverticullitis coming home from hospital vs moving to hospice home  this week .  Her brother is a  36 yr old recovering drug/gambling addict who is now living with her mother after having  relapse and spending time at [facility]. Grandmother of 2 yr old  who is in [place] with baby daddy   History [place] has a past medical history of Allergy, Arthritis, Depression, GERD (gastroesophageal reflux disease), and UTI (urinary tract infection).   She has a past surgical history that includes Tubal ligation (1993) and Colonoscopy with propofol (N/A, 07/07/2018).   Her family history includes Alcohol abuse in her father; Arthritis in her father and paternal grandmother; Cancer in her maternal aunt and maternal uncle; Cancer (age of onset: 73) in her maternal aunt; Cancer (age of onset: 43) in her maternal uncle; Diabetes in her maternal grandfather; Hyperlipidemia in her father and maternal grandfather; Hypertension in her father and maternal grandfather; Mental illness in her maternal aunt; Mental retardation in her maternal uncle; Stroke in her father.She reports that she quit smoking about 29 years ago. Her smoking use included cigarettes. She has never used smokeless tobacco. She reports current alcohol use of about 4.0 standard drinks of alcohol per  week. She reports that she does not use drugs.  Outpatient Medications Prior to Visit  Medication Sig Dispense Refill   cetirizine (ZYRTEC) 10 MG tablet Take 10 mg by mouth daily. (Patient not taking: Reported on 08/26/2022)     fluticasone (FLONASE) 50 MCG/ACT nasal spray Place 2 sprays into both nostrils daily. 16 g 6   Probiotic Product (PROBIOTIC PO) Take by mouth daily.     triamcinolone cream (KENALOG) 0.1 % Apply to affected skin bid x 2 weeks 30 g 0   No facility-administered medications prior to visit.    Review of Systems:  Patient denies headache, fevers, malaise, unintentional weight loss, skin rash, eye pain, sinus congestion and sinus pain, sore throat, dysphagia,  hemoptysis , cough, dyspnea, wheezing, chest pain, palpitations, orthopnea, edema, abdominal pain, nausea, melena, diarrhea, constipation, flank pain, dysuria, hematuria, urinary  Frequency, nocturia, numbness, tingling, seizures,  Focal weakness, Loss of consciousness,  Tremor, insomnia, depression, anxiety, and suicidal ideation.     Objective:  BP 132/80   Pulse 64   Temp 98.1 F (36.7 C)   Ht 5' 5.5" (1.664 m)   Wt 155 lb 6.4 oz (70.5 kg)   LMP 12/10/2017 (Approximate)   SpO2 97%   BMI 25.47 kg/m   Physical Exam:  General appearance: alert, cooperative and appears stated age Ears: normal TM's and external ear canals both ears Throat: lips, mucosa, and tongue normal; teeth and gums normal Neck: no adenopathy, no carotid bruit, supple, symmetrical, trachea midline and thyroid not enlarged, symmetric, no tenderness/mass/nodules Back: symmetric, no  curvature. ROM normal. No CVA tenderness. Lungs: clear to auscultation bilaterally Heart: regular rate and rhythm, S1, S2 normal, no murmur, click, rub or gallop Abdomen: soft, non-tender; bowel sounds normal; no masses,  no organomegaly Pulses: 2+ and symmetric Skin: Skin color, texture, turgor normal. No rashes or lesions Lymph nodes: Cervical,  supraclavicular, and axillary nodes normal.   Assessment & Plan:   Problem List Items Addressed This Visit     Chronic constipation    Uses mg oxide 500 mg daily to manage,  Has daily stools .  Encouraged to  increase the fiber in her diet to 25 g daily using  the low carb breads .  Also recommended adding  miralax, metamucil, fibercon, or citrucel daily to supplement her fiber. She was advised that she  can also combine them daily with colace,  Also,  make 4 16 ounce servings of water a daily goal       Diverticulosis    With chronic constipation for the past 5 years        Moderate mixed hyperlipidemia not requiring statin therapy - Primary   Relevant Orders   Comprehensive metabolic panel (Completed)   TSH (Completed)    I have discontinued Aurorah C. Lahue's Probiotic Product (PROBIOTIC PO), cetirizine, fluticasone, and triamcinolone cream. I am also having her start on Zoster Vaccine Adjuvanted.  Meds ordered this encounter  Medications   Zoster Vaccine Adjuvanted [facility]) injection    Sig: Inject 0.5 mLs into the muscle once for 1 dose.    Dispense:  1 each    Refill:  1    Medications Discontinued During This Encounter  Medication Reason   cetirizine (ZYRTEC) 10 MG tablet Patient Preference   fluticasone (FLONASE) 50 MCG/ACT nasal spray    Probiotic Product (PROBIOTIC PO)    triamcinolone cream (KENALOG) 0.1 %     Follow-up: Return in about 1 year (around 08/27/2023).   Crecencio Mc, MD

## 2022-08-26 NOTE — Assessment & Plan Note (Addendum)
Uses mg oxide 500 mg daily to manage,  Has daily stools .  Encouraged to  increase the fiber in her diet to 25 g daily using  the low carb breads .  Also recommended adding  miralax, metamucil, fibercon, or citrucel daily to supplement her fiber. She was advised that she  can also combine them daily with colace,  Also,  make 4 16 ounce servings of water a daily goal

## 2022-08-26 NOTE — Assessment & Plan Note (Signed)
With chronic constipation for the past 5 years

## 2022-08-26 NOTE — Patient Instructions (Addendum)
Welcome back!   Your cholesterol is FINE.  YOU DO NOT NEED MEDICATION.  YOUR 10 YR RISK OF EVENTS IS 2%  YU HAVE DIVERTICULOSIS WHICH CAN BE AGGRAVATED BY CONSTIPATION  YOU NEED 25 G OF FIBER AND 60 OUNCES OF WATER DAILY  FOR HEALTHY BOWELS   THE [place] HAS THE HIGHEST FIBER CONTENT OF ANY I HAVE SEEN   25 GRAMS.  EAT ONE DAILY  . Other options are taking benefiber daily with 8 ounces of water  (preferable to daily magnesium)

## 2023-01-27 ENCOUNTER — Encounter: Payer: 59 | Admitting: Dermatology

## 2023-02-16 ENCOUNTER — Other Ambulatory Visit: Payer: Self-pay | Admitting: Dermatology

## 2023-02-16 ENCOUNTER — Encounter: Payer: Self-pay | Admitting: Dermatology

## 2023-02-16 ENCOUNTER — Ambulatory Visit (INDEPENDENT_AMBULATORY_CARE_PROVIDER_SITE_OTHER): Payer: 59 | Admitting: Dermatology

## 2023-02-16 ENCOUNTER — Telehealth: Payer: Self-pay

## 2023-02-16 VITALS — BP 144/77 | HR 72

## 2023-02-16 DIAGNOSIS — L820 Inflamed seborrheic keratosis: Secondary | ICD-10-CM

## 2023-02-16 DIAGNOSIS — L821 Other seborrheic keratosis: Secondary | ICD-10-CM

## 2023-02-16 DIAGNOSIS — L719 Rosacea, unspecified: Secondary | ICD-10-CM

## 2023-02-16 DIAGNOSIS — L814 Other melanin hyperpigmentation: Secondary | ICD-10-CM

## 2023-02-16 DIAGNOSIS — D2261 Melanocytic nevi of right upper limb, including shoulder: Secondary | ICD-10-CM | POA: Diagnosis not present

## 2023-02-16 DIAGNOSIS — L738 Other specified follicular disorders: Secondary | ICD-10-CM

## 2023-02-16 DIAGNOSIS — R21 Rash and other nonspecific skin eruption: Secondary | ICD-10-CM

## 2023-02-16 DIAGNOSIS — D239 Other benign neoplasm of skin, unspecified: Secondary | ICD-10-CM

## 2023-02-16 DIAGNOSIS — Z1283 Encounter for screening for malignant neoplasm of skin: Secondary | ICD-10-CM | POA: Diagnosis not present

## 2023-02-16 DIAGNOSIS — D489 Neoplasm of uncertain behavior, unspecified: Secondary | ICD-10-CM

## 2023-02-16 DIAGNOSIS — L578 Other skin changes due to chronic exposure to nonionizing radiation: Secondary | ICD-10-CM

## 2023-02-16 DIAGNOSIS — L853 Xerosis cutis: Secondary | ICD-10-CM

## 2023-02-16 HISTORY — DX: Other benign neoplasm of skin, unspecified: D23.9

## 2023-02-16 MED ORDER — IVERMECTIN 1 % EX CREA: TOPICAL_CREAM | CUTANEOUS | 5 refills | Status: DC

## 2023-02-16 NOTE — Telephone Encounter (Signed)
Insurance will not cover Soolantra Cream until Azelaic Acid gel or Metronidazole have been tried and failed.

## 2023-02-16 NOTE — Patient Instructions (Addendum)
Biopsy Wound Care Instructions  Leave the original bandage on for 24 hours if possible.  If the bandage becomes soaked or soiled before that time, it is OK to remove it and examine the wound.  A small amount of post-operative bleeding is normal.  If excessive bleeding occurs, remove the bandage, place gauze over the site and apply continuous pressure (no peeking) over the area for 30 minutes. If this does not work, please call our clinic as soon as possible or page your doctor if it is after hours.   Once a day, cleanse the wound with soap and water. It is fine to shower. If a thick crust develops you may use a Q-tip dipped into dilute hydrogen peroxide (mix 1:1 with water) to dissolve it.  Hydrogen peroxide can slow the healing process, so use it only as needed.    After washing, apply petroleum jelly (Vaseline) or an antibiotic ointment if your doctor prescribed one for you, followed by a bandage.    For best healing, the wound should be covered with a layer of ointment at all times. If you are not able to keep the area covered with a bandage to hold the ointment in place, this may mean re-applying the ointment several times a day.  Continue this wound care until the wound has healed and is no longer open.   Itching and mild discomfort is normal during the healing process. However, if you develop pain or severe itching, please call our office.   If you have any discomfort, you can take Tylenol (acetaminophen) or ibuprofen as directed on the bottle. (Please do not take these if you have an allergy to them or cannot take them for another reason).  Some redness, tenderness and white or yellow material in the wound is normal healing.  If the area becomes very sore and red, or develops a thick yellow-green material (pus), it may be infected; please notify us.    If you have stitches, return to clinic as directed to have the stitches removed. You will continue wound care for 2-3 days after the  stitches are removed.   Wound healing continues for up to one year following surgery. It is not unusual to experience pain in the scar from time to time during the interval.  If the pain becomes severe or the scar thickens, you should notify the office.    A slight amount of redness in a scar is expected for the first six months.  After six months, the redness will fade and the scar will soften and fade.  The color difference becomes less noticeable with time.  If there are any problems, return for a post-op surgery check at your earliest convenience.  To improve the appearance of the scar, you can use silicone scar gel, cream, or sheets (such as Mederma or Serica) every night for up to one year. These are available over the counter (without a prescription).  Please call our office at [phone] for any questions or concerns.    Start soolantra cream - apply daily to entire face.   If not covered    Folliculitis / with rosacea   Folliculitis occurs when hair follicles become inflamed. A hair follicle is a tiny opening in your skin where your hair grows from. This condition often occurs on the scalp, thighs, legs, back, and buttocks but can happen anywhere on the body. What are the causes? A common cause of this condition is an infection from bacteria. The type  of folliculitis caused by bacteria can last a long time or go away and come back. The bacteria can live anywhere on your skin. They are often found in the nostrils. Other causes may include: An infection from a fungus. An infection from a virus. Your skin touching some chemicals, such as oils and tars. Shaving or waxing. Greasy ointments or creams put on the skin. What increases the risk? You are more likely to develop this condition if: Your body has a weak disease-fighting system (immune system). You have diabetes. You are obese. What are the signs or symptoms? Symptoms of this condition  include: Redness. Soreness. Swelling. Itching. Small white or yellow, itchy spots filled with pus (pustules) that appear over a red area. If the infection goes deep into the follicle, these may turn into a boil (furuncle). A group of boils (carbuncle). These tend to form in hairy, sweaty areas of the body. How is this diagnosed? This condition is diagnosed with a skin exam. Your health care provider may take a sample of one of the pustules or boils to test in a lab. How is this treated? This condition may be treated by: Putting a warm, wet cloth (warm compress) on the affected areas. Taking antibiotics or applying them to the skin. Applying or bathing with a solution that kills germs (antiseptic). Taking an over-the-counter medicine. This can help with itching. Having a procedure to drain pustules or boils. This may be done if a pustule or boil contains a lot of pus or fluid. Having laser hair removal. This may be done when the condition lasts for a long time. Follow these instructions at home: Managing pain and swelling  If directed, apply heat to the affected area as often as told by your health care provider. Use the heat source that your health care provider recommends, such as a moist heat pack or a heating pad. Place a towel between your skin and the heat source. Leave the heat on for 20-30 minutes. If your skin turns bright red, remove the heat right away to prevent burns. The risk of burns is higher if you cannot feel pain, heat, or cold. General instructions Take over-the-counter and prescription medicines only as told by your health care provider. If you were prescribed antibiotics, take or apply them as told by your health care provider. Do not stop using the antibiotic even if you start to feel better. Check your irritated area every day for signs of infection. Check for: More redness, swelling, or pain. Fluid or blood. Warmth. Pus or a bad smell. Do not shave irritated  skin. Keep all follow-up visits. Your health care provider will check if the treatments are helping. Contact a health care provider if: You have a fever. You have any signs of infection. Red streaks are spreading from the affected area. This information is not intended to replace advice given to you by your health care provider. Make sure you discuss any questions you have with your health care provider. Document Revised: 05/18/2022 Document Reviewed: 05/18/2022 Elsevier Patient Education  [place]. Seborrheic Keratosis  What causes seborrheic keratoses? Seborrheic keratoses are harmless, common skin growths that first appear during adult life.  As time goes by, more growths appear.  Some people may develop a large number of them.  Seborrheic keratoses appear on both covered and uncovered body parts.  They are not caused by sunlight.  The tendency to develop seborrheic keratoses can be inherited.  They vary in color from skin-colored to  gray, brown, or even black.  They can be either smooth or have a rough, warty surface.   Seborrheic keratoses are superficial and look as if they were stuck on the skin.  Under the microscope this type of keratosis looks like layers upon layers of skin.  That is why at times the top layer may seem to fall off, but the rest of the growth remains and re-grows.    Treatment Seborrheic keratoses do not need to be treated, but can easily be removed in the office.  Seborrheic keratoses often cause symptoms when they rub on clothing or jewelry.  Lesions can be in the way of shaving.  If they become inflamed, they can cause itching, soreness, or burning.  Removal of a seborrheic keratosis can be accomplished by freezing, burning, or surgery. If any spot bleeds, scabs, or grows rapidly, please return to have it checked, as these can be an indication of a skin cancer.    Cryotherapy Aftercare  Wash gently with soap and water everyday.   Apply Vaseline and  Band-Aid daily until healed.    Recommend OTC [place] Rapid Relief Anti-Itch cream (pramoxine + menthol), CeraVe Anti-itch cream or lotion (pramoxine), Sarna lotion (Original- menthol + camphor or Sensitive- pramoxine) or Eucerin 12 hour Itch Relief lotion (menthol) up to 3 times per day to areas on body that are itchy.     Gentle Skin Care Guide  1. Bathe no more than once a day.  2. Avoid bathing in hot water  3. Use a mild soap like Dove, Vanicream, Cetaphil, CeraVe. Can use Lever 2000 or Cetaphil antibacterial soap  4. Use soap only where you need it. On most days, use it under your arms, between your legs, and on your feet. Let the water rinse other areas unless visibly dirty.  5. When you get out of the bath/shower, use a towel to gently blot your skin dry, don't rub it.  6. While your skin is still a little damp, apply a moisturizing cream such as Vanicream, CeraVe, Cetaphil, Eucerin, Sarna lotion or plain Vaseline Jelly. For hands apply Neutrogena [place] Hand Cream or Excipial Hand Cream.  7. Reapply moisturizer any time you start to itch or feel dry.  8. Sometimes using free and clear laundry detergents can be helpful. Fabric softener sheets should be avoided. Downy Free & Gentle liquid, or any liquid fabric softener that is free of dyes and perfumes, it acceptable to use  9. If your doctor has given you prescription creams you may apply moisturizers over them      Melanoma ABCDEs  Melanoma is the most dangerous type of skin cancer, and is the leading cause of death from skin disease.  You are more likely to develop melanoma if you: Have light-colored skin, light-colored eyes, or red or blond hair Spend a lot of time in the sun Tan regularly, either outdoors or in a tanning bed Have had blistering sunburns, especially during childhood Have a close family member who has had a melanoma Have atypical moles or large birthmarks  Early detection of melanoma is key  since treatment is typically straightforward and cure rates are extremely high if we catch it early.   The first sign of melanoma is often a change in a mole or a new dark spot.  The ABCDE system is a way of remembering the signs of melanoma.  A for asymmetry:  The two halves do not match. B for border:  The edges of the  growth are irregular. C for color:  A mixture of colors are present instead of an even brown color. D for diameter:  Melanomas are usually (but not always) greater than 41m - the size of a pencil eraser. E for evolution:  The spot keeps changing in size, shape, and color.  Please check your skin once per month between visits. You can use a small mirror in front and a large mirror behind you to keep an eye on the back side or your body.   If you see any new or changing lesions before your next follow-up, please call to schedule a visit.  Please continue daily skin protection including broad spectrum sunscreen SPF 30+ to sun-exposed areas, reapplying every 2 hours as needed when you're outdoors.   Staying in the shade or wearing long sleeves, sun glasses (UVA+UVB protection) and wide brim hats (4-inch brim around the entire circumference of the hat) are also recommended for sun protection.    Due to recent changes in healthcare laws, you may see results of your pathology and/or laboratory studies on MyChart before the doctors have had a chance to review them. We understand that in some cases there may be results that are confusing or concerning to you. Please understand that not all results are received at the same time and often the doctors may need to interpret multiple results in order to provide you with the best plan of care or course of treatment. Therefore, we ask that you please give u[place]2 business days to thoroughly review all your results before contacting the office for clarification. Should we see a critical lab result, you will be contacted sooner.   If You Need  Anything After Your Visit  If you have any questions or concerns for your doctor, please call our main line at [phone] press option 4 to reach your doctor's medical assistant. If no one answers, please leave a voicemail as directed and we will return your call as soon as possible. Messages left after 4 pm will be answered the following business day.   You may also send [place] message via [place] We typically respond to MyChart messages within 1-2 business days.  For prescription refills, please ask your pharmacy to contact our office. Our fax number is [phone]  If you have an urgent issue when the clinic is closed that cannot wait until the next business day, you can page your doctor at the number below.    Please note that while we do our best to be available for urgent issues outside of office hours, we are not available 24/7.   If you have an urgent issue and are unable to reach u[place] you may choose to seek medical care at your doctor's office, retail clinic, urgent care center, or emergency room.  If you have a medical emergency, please immediately call 911 or go to the emergency department.  Pager Numbers  - Dr. KNehemiah Massed [phone] - Dr. MLaurence Ferrari [phone] - Dr. SNicole Kindred [phone] In the event of inclement weather, please call our main line at [phone] an update on the status of any delays or closures.  Dermatology Medication Tips: Please keep the boxes that topical medications come in in order to help keep track of the instructions about where and how to use these. Pharmacies typically print the medication instructions only on the boxes and not directly on the medication tubes.   If your medication is too expensive, please contact our office at  [phone] option 4 or send [place] a message through [place].   We are unable to tell what your co-pay for medications will be in advance as this is different depending on your insurance coverage. However, we may  be able to find a substitute medication at lower cost or fill out paperwork to get insurance to cover a needed medication.   If a prior authorization is required to get your medication covered by your insurance company, please allow [place] 1-2 business days to complete this process.  Drug prices often vary depending on where the prescription is filled and some pharmacies may offer cheaper prices.  The website www.goodrx.com contains coupons for medications through different pharmacies. The prices here do not account for what the cost may be with help from insurance (it may be cheaper with your insurance), but the website can give you the price if you did not use any insurance.  - You can print the associated coupon and take it with your prescription to the pharmacy.  - You may also stop by our office during regular business hours and pick up a GoodRx coupon card.  - If you need your prescription sent electronically to a different pharmacy, notify our office through [facility] or by phone at [phone] option 4.     Si Usted Necesita Algo Despus de Su Visita  Tambin puede enviarnos un mensaje a travs de Pharmacist, community. Por lo general respondemos a los mensajes de MyChart en el transcurso de 1 a 2 das hbiles.  Para renovar recetas, por favor pida a su farmacia que se ponga en contacto con nuestra oficina. Harland Dingwall de fax es [place] [phone].  Si tiene un asunto urgente cuando la clnica est cerrada y que no puede esperar hasta el siguiente da hbil, puede llamar/localizar a su doctor(a) al nmero que aparece a continuacin.   Por favor, tenga en cuenta que aunque hacemos todo lo posible para estar disponibles para asuntos urgentes fuera del horario de [place], no estamos disponibles las 24 horas del da, los 7 das de la [place].   Si tiene un problema urgente y no puede comunicarse con nosotros, puede optar por buscar atencin mdica  en el consultorio de su doctor(a), en una  clnica privada, en un centro de atencin urgente o en una sala de emergencias.  Si tiene Engineering geologist, por favor llame inmediatamente al 911 o vaya a la sala de emergencias.  Nmeros de bper  - Dr. Nehemiah Massed: [phone]  - Dra. Moye: [phone]  - Dra. Nicole Kindred: [phone]  En caso de inclemencias del [place], por favor llame a Johnsie Kindred principal al [phone] para una actualizacin sobre el [place] de cualquier retraso o cierre.  Consejos para la medicacin en dermatologa: Por favor, guarde las cajas en las que vienen los medicamentos de uso tpico para ayudarle a seguir las instrucciones sobre dnde y cmo usarlos. Las farmacias generalmente imprimen las instrucciones del medicamento slo en las cajas y no directamente en los tubos del [place].   Si su medicamento es muy caro, por favor, pngase en contacto con Zigmund Daniel llamando al [phone] y presione la opcin 4 o envenos un mensaje a travs de Pharmacist, community.   No podemos decirle cul ser su copago por los medicamentos por adelantado ya que esto es diferente dependiendo de la cobertura de su seguro. Sin embargo, es posible que podamos encontrar un medicamento sustituto a Electrical engineer un formulario para que el seguro cubra el medicamento que se [place]  necesario.   Si se requiere una autorizacin previa para que su compaa de seguros [place] su medicamento, por favor permtanos de 1 a 2 das hbiles para completar este proceso.  Los precios de los medicamentos varan con frecuencia dependiendo del Environmental consultant de dnde se surte la receta y alguna farmacias pueden ofrecer precios ms baratos.  El sitio web www.goodrx.com tiene cupones para medicamentos de Airline pilot. Los precios aqu no tienen en cuenta lo que podra costar con la ayuda del seguro (puede ser ms barato con su seguro), pero el sitio web puede darle el precio si no utiliz Research scientist (physical sciences).  - Puede imprimir el cupn correspondiente y  llevarlo con su receta a la farmacia.  - Tambin puede pasar por nuestra oficina durante el horario de atencin regular y Charity fundraiser una tarjeta de cupones de GoodRx.  - Si necesita que su receta se enve electrnicamente a una farmacia diferente, informe a nuestra oficina a travs de MyChart de [place] o por telfono llamando al [phone] y presione la opcin 4.

## 2023-02-16 NOTE — Progress Notes (Signed)
Follow-Up Visit   Subjective  Jessica Thornton is a 57 y.o. female who presents for the following: Annual Exam (Tbse, hx of isk, hx of aks, chest itchy areas at chest patient given tmc cream to use but did not start. Dry skin and bumps at forehead ).  The patient presents for Total-Body Skin Exam (TBSE) for skin cancer screening and mole check.  The patient has spots, moles and lesions to be evaluated, some may be new or changing and the patient has concerns that these could be cancer.  The following portions of the chart were reviewed this encounter and updated as appropriate:  Tobacco  Allergies  Meds  Problems  Med Hx  Surg Hx  Fam Hx      Review of Systems: No other skin or systemic complaints except as noted in HPI or Assessment and Plan.   Objective  Well appearing patient in no apparent distress; mood and affect are within normal limits.  A full examination was performed including scalp, head, eyes, ears, nose, lips, neck, chest, axillae, abdomen, back, buttocks, bilateral upper extremities, bilateral lower extremities, hands, feet, fingers, toes, fingernails, and toenails. All findings within normal limits unless otherwise noted below.  left chest Scaly pink papules  left thigh above knee x 1 Erythematous stuck-on, waxy papule or plaque  face Erythematous patches with papules and follicular spines KOH positive for demodex  right superior shoulder 0.7 cm irregular medium to dark thin macule        Head - Anterior (Face) Mid-face erythema with erythematous papules   Assessment & Plan  Rash and other nonspecific skin eruption left chest  C/w dermatitis  Call if not resolving/persistent  Can Start Triamcinolone cream apply to affected skin bid x 2 weeks. Avoid applying to face, groin, and axilla. Use as directed. Long-term use can cause thinning of the skin. Call if rash  Recommend OTC [place] Rapid Relief Anti-Itch cream (pramoxine + menthol),  CeraVe Anti-itch cream or lotion (pramoxine), Sarna lotion (Original- menthol + camphor or Sensitive- pramoxine) or Eucerin 12 hour Itch Relief lotion (menthol) up to 3 times per day to areas on body that are itchy.  Topical steroids (such as triamcinolone, fluocinolone, fluocinonide, mometasone, clobetasol, halobetasol, betamethasone, hydrocortisone) can cause thinning and lightening of the skin if they are used for too long in the same area. Your physician has selected the right strength medicine for your problem and area affected on the body. Please use your medication only as directed by your physician to prevent side effects.    Inflamed seborrheic keratosis left thigh above knee x 1  Symptomatic, irritating, patient would like treated.  Benign-appearing.  Call clinic for new or changing lesions. Will recheck at next follow up, if not resolved will consider bx   Prior to procedure, discussed risks of blister formation, small wound, skin dyspigmentation, or rare scar following treatment. Recommend Vaseline ointment to treated areas while healing.   Destruction of lesion - left thigh above knee x 1  Destruction method: cryotherapy   Informed consent: discussed and consent obtained   Lesion destroyed using liquid nitrogen: Yes   Cryotherapy cycles:  2 Outcome: patient tolerated procedure well with no complications   Post-procedure details: wound care instructions given    Folliculitis due to Dermodex face  Will start ivermectin 1 % cream apply to face daily  If not covered by insurance will send in metronidazole cream to use twice a day  Denies grittiness of eyes  Ivermectin (SOOLANTRA) 1 % CREA - face Apply topically to face daily  Neoplasm of uncertain behavior right superior shoulder  Epidermal / dermal shaving  Lesion diameter (cm):  0.7 Informed consent: discussed and consent obtained   Timeout: patient name, date of birth, surgical site, and procedure  verified   Patient was prepped and draped in usual sterile fashion: area prepped with isopropyl alcohol. Anesthesia: the lesion was anesthetized in a standard fashion   Anesthetic:  1% lidocaine w/ epinephrine 1-100,000 buffered w/ 8.4% NaHCO3 Instrument used: #15 blade   Hemostasis achieved with: aluminum chloride   Outcome: patient tolerated procedure well   Post-procedure details: wound care instructions given   Additional details:  Mupirocin and a bandage applied  Specimen 1 - Surgical pathology Differential Diagnosis: R/o atypia  Check Margins: No  R/o atypia     Rosacea Head - Anterior (Face)  Chronic and persistent condition with duration or expected duration over one year. Condition is symptomatic/ bothersome to patient. Not currently at goal.  Denies grittiness of eyes  Will start ivermectin 1 % cream apply to face daily  If not covered by insurance will send in metronidazole cream to use twice a day  Rosacea is a chronic progressive skin condition usually affecting the face of adults, causing redness and/or acne bumps. It is treatable but not curable. It sometimes affects the eyes (ocular rosacea) as well. It may respond to topical and/or systemic medication and can flare with stress, sun exposure, alcohol, exercise, topical steroids (including hydrocortisone/cortisone 10) and some foods.  Daily application of broad spectrum spf 30+ sunscreen to face is recommended to reduce flares.   Lentigines - Scattered tan macules - Due to sun exposure - Benign-appearing, observe - Recommend daily broad spectrum sunscreen SPF 30+ to sun-exposed areas, reapply every 2 hours as needed. - Call for any changes  Seborrheic Keratoses - Stuck-on, waxy, tan-brown papules and/or plaques  - Benign-appearing - Discussed benign etiology and prognosis. - Observe - Call for any changes  Dermatofibroma Right posterior upper arm - Firm pink/brown papulenodule with dimple sign - Benign  appearing - Call for any changes   Melanocytic Nevi - Tan-brown and/or pink-flesh-colored symmetric macules and papules - Benign appearing on exam today - Observation - Call clinic for new or changing moles - Recommend daily use of broad spectrum spf 30+ sunscreen to sun-exposed areas.   Xerosis - diffuse xerotic patches - recommend gentle, hydrating skin care - gentle skin care handout given  Hemangiomas - Red papules - Discussed benign nature - Observe - Call for any changes  Actinic Damage - Chronic condition, secondary to cumulative UV/sun exposure - diffuse scaly erythematous macules with underlying dyspigmentation - Recommend daily broad spectrum sunscreen SPF 30+ to sun-exposed areas, reapply every 2 hours as needed.  - Staying in the shade or wearing long sleeves, sun glasses (UVA+UVB protection) and wide brim hats (4-inch brim around the entire circumference of the hat) are also recommended for sun protection.  - Call for new or changing lesions.  Skin cancer screening performed today. Return for 6 week follow up on folliculitis / rechecked isk at left lower leg, 1 year tbse .  I, Ruthell Rummage, CMA, am acting as scribe for Forest Gleason, MD.  Documentation: I have reviewed the above documentation for accuracy and completeness, and I agree with the above.  Forest Gleason, MD

## 2023-02-16 NOTE — Telephone Encounter (Signed)
Please send metronidazole cream twice a day to face. Thank you!

## 2023-02-17 MED ORDER — METRONIDAZOLE 0.75 % EX CREA: TOPICAL_CREAM | Freq: Two times a day (BID) | CUTANEOUS | 2 refills | Status: DC

## 2023-02-17 NOTE — Telephone Encounter (Signed)
Patient advised of medication change. aw

## 2023-02-18 ENCOUNTER — Encounter: Payer: Self-pay | Admitting: Dermatology

## 2023-02-22 ENCOUNTER — Telehealth: Payer: Self-pay

## 2023-02-22 NOTE — Telephone Encounter (Signed)
Discussed pathology results. Patient voiced understanding. Harris for follow up.

## 2023-02-22 NOTE — Telephone Encounter (Signed)
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 On the spectrum from normal mole to melanoma skin cancer, this is in between the two. - We need to recheck this area sometime in the next 6 months to be sure there is no evidence of the atypical mole coming back. If there is any color coming back, we would recommend repeating the biopsy to be sure the cells look normal.  - People who have a history of atypical moles do have a slightly increased risk of developing melanoma somewhere on the body, so a yearly full body skin exam by a dermatologist is recommended.  - Monthly self skin checks and daily sun protection are also recommended.  - Please call if you notice a dark spot coming back where this biopsy was taken.  - Please also call if you notice any new or changing spots anywhere else on the body before your follow-up visit.     MAs please call. Thank you!

## 2023-03-30 ENCOUNTER — Encounter: Payer: Self-pay | Admitting: Dermatology

## 2023-03-30 ENCOUNTER — Ambulatory Visit (INDEPENDENT_AMBULATORY_CARE_PROVIDER_SITE_OTHER): Payer: 59 | Admitting: Dermatology

## 2023-03-30 VITALS — BP 145/64

## 2023-03-30 DIAGNOSIS — Z86018 Personal history of other benign neoplasm: Secondary | ICD-10-CM

## 2023-03-30 DIAGNOSIS — L738 Other specified follicular disorders: Secondary | ICD-10-CM

## 2023-03-30 DIAGNOSIS — L905 Scar conditions and fibrosis of skin: Secondary | ICD-10-CM | POA: Diagnosis not present

## 2023-03-30 MED ORDER — PERMETHRIN 5 % EX CREA: TOPICAL_CREAM | CUTANEOUS | 0 refills | Status: AC

## 2023-03-30 NOTE — Progress Notes (Signed)
   Follow-Up Visit   Subjective  Jessica Thornton is a 57 y.o. female who presents for the following: 6 week ISK follow up at left thigh above knee, treated with LN2. Patient advises improved. Folliculitis follow up at face, ivermectin not covered so patient is using metronidazole. Some improvement but still with itching and dryness.  The following portions of the chart were reviewed this encounter and updated as appropriate: medications, allergies, medical history  Review of Systems:  No other skin or systemic complaints except as noted in HPI or Assessment and Plan.  Objective  Well appearing patient in no apparent distress; mood and affect are within normal limits.   A focused examination was performed of the following areas: Legs, face  Relevant exam findings are noted in the Assessment and Plan.    Assessment & Plan   FOLLICULITIS due to Demodex Exam: Perifollicular erythematous papules and pustules with spines  Treatment Plan: Improved but not clearing with metronidazole cream.  Start permethrin cream once weekly x 8 weeks.  If not clearing consider oral ivermectin +/- oral metronidazole on follow up.  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  Call if any new or changing lesions are noted between office visits  SCAR Exam: Dyspigmented smooth macule or patch. Benign-appearing.  Observation.  Call clinic for new or changing lesions. Recommend daily broad spectrum sunscreen SPF 30+, reapply every 2 hours as needed. Treatment: Recommend Serica moisturizing scar formula cream every night or [facility] brand or Mederma silicone scar sheet every night for the first year after a scar appears to help with scar remodeling if desired. Scars remodel on their own for a full year and will gradually improve in appearance over  time.   Return in about 2 months (around 05/30/2023).  Graciella Belton, RMA, am acting as scribe for Forest Gleason, MD .   Documentation: I have reviewed the above documentation for accuracy and completeness, and I agree with the above.  Forest Gleason, MD

## 2023-03-30 NOTE — Patient Instructions (Addendum)
Recommend Serica moisturizing scar formula cream every night or [facility] brand or Mederma silicone scar sheet every night for the first year after a scar appears to help with scar remodeling if desired. Scars remodel on their own for a full year and will gradually improve in appearance over time.  Start permethrin cream once weekly x 8 weeks.  If not clearing consider oral ivermectin +/- oral metronidazole on follow up.  Due to recent changes in healthcare laws, you may see results of your pathology and/or laboratory studies on MyChart before the doctors have had a chance to review them. We understand that in some cases there may be results that are confusing or concerning to you. Please understand that not all results are received at the same time and often the doctors may need to interpret multiple results in order to provide you with the best plan of care or course of treatment. Therefore, we ask that you please give [place] 2 business days to thoroughly review all your results before contacting the office for clarification. Should we see a critical lab result, you will be contacted sooner.   If You Need Anything After Your Visit  If you have any questions or concerns for your doctor, please call our main line at [phone] and press option 4 to reach your doctor's medical assistant. If no one answers, please leave a voicemail as directed and we will return your call as soon as possible. Messages left after 4 pm will be answered the following business day.   You may also send [place] a message via [place]. We typically respond to MyChart messages within 1-2 business days.  For prescription refills, please ask your pharmacy to contact our office. Our fax number is [phone].  If you have an urgent issue when the clinic is closed that cannot wait until the next business day, you can page your doctor at the number below.    Please note that while we do our best to be available for urgent issues outside  of office hours, we are not available 24/7.   If you have an urgent issue and are unable to reach [place], you may choose to seek medical care at your doctor's office, retail clinic, urgent care center, or emergency room.  If you have a medical emergency, please immediately call 911 or go to the emergency department.  Pager Numbers  - Dr. Nehemiah Massed: [phone]  - Dr. Laurence Ferrari: [phone]  - Dr. Nicole Kindred: [phone]  In the event of inclement weather, please call our main line at [phone] for an update on the status of any delays or closures.  Dermatology Medication Tips: Please keep the boxes that topical medications come in in order to help keep track of the instructions about where and how to use these. Pharmacies typically print the medication instructions only on the boxes and not directly on the medication tubes.   If your medication is too expensive, please contact our office at [phone] option 4 or send [place] a message through [place].   We are unable to tell what your co-pay for medications will be in advance as this is different depending on your insurance coverage. However, we may be able to find a substitute medication at lower cost or fill out paperwork to get insurance to cover a needed medication.   If a prior authorization is required to get your medication covered by your insurance company, please allow [place] 1-2 business days to complete this process.  Drug prices often vary depending on  where the prescription is filled and some pharmacies may offer cheaper prices.  The website www.goodrx.com contains coupons for medications through different pharmacies. The prices here do not account for what the cost may be with help from insurance (it may be cheaper with your insurance), but the website can give you the price if you did not use any insurance.  - You can print the associated coupon and take it with your prescription to the pharmacy.  - You may also stop by our office  during regular business hours and pick up a GoodRx coupon card.  - If you need your prescription sent electronically to a different pharmacy, notify our office through [facility] or by phone at [phone] option 4.     Si Usted Necesita Algo Despus de Su Visita  Tambin puede enviarnos un mensaje a travs de Pharmacist, community. Por lo general respondemos a los mensajes de MyChart en el transcurso de 1 a 2 das hbiles.  Para renovar recetas, por favor pida a su farmacia que se ponga en contacto con nuestra oficina. Harland Dingwall de fax es [place] [phone].  Si tiene un asunto urgente cuando la clnica est cerrada y que no puede esperar hasta el siguiente da hbil, puede llamar/localizar a su doctor(a) al nmero que aparece a continuacin.   Por favor, tenga en cuenta que aunque hacemos todo lo posible para estar disponibles para asuntos urgentes fuera del horario de [place], no estamos disponibles las 24 horas del da, los 7 das de la [place].   Si tiene un problema urgente y no puede comunicarse con nosotros, puede optar por buscar atencin mdica  en el consultorio de su doctor(a), en una clnica privada, en un centro de atencin urgente o en una sala de emergencias.  Si tiene Engineering geologist, por favor llame inmediatamente al 911 o vaya a la sala de emergencias.  Nmeros de bper  - Dr. Nehemiah Massed: [phone]  - Dra. Moye: [phone]  - Dra. Nicole Kindred: [phone]  En caso de inclemencias del [place], por favor llame a Johnsie Kindred principal al [phone] para una actualizacin sobre el [place] de cualquier retraso o cierre.  Consejos para la medicacin en dermatologa: Por favor, guarde las cajas en las que vienen los medicamentos de uso tpico para ayudarle a seguir las instrucciones sobre dnde y cmo usarlos. Las farmacias generalmente imprimen las instrucciones del medicamento slo en las cajas y no directamente en los tubos del [place].   Si su medicamento es  muy caro, por favor, pngase en contacto con Zigmund Daniel llamando al [phone] y presione la opcin 4 o envenos un mensaje a travs de Pharmacist, community.   No podemos decirle cul ser su copago por los medicamentos por adelantado ya que esto es diferente dependiendo de la cobertura de su seguro. Sin embargo, es posible que podamos encontrar un medicamento sustituto a Electrical engineer un formulario para que el seguro cubra el medicamento que se considera necesario.   Si se requiere una autorizacin previa para que su compaa de seguros [place] su medicamento, por favor permtanos de 1 a 2 das hbiles para completar este proceso.  Los precios de los medicamentos varan con frecuencia dependiendo del Environmental consultant de dnde se surte la receta y alguna farmacias pueden ofrecer precios ms baratos.  El sitio web www.goodrx.com tiene cupones para medicamentos de Airline pilot. Los precios aqu no tienen en cuenta lo que podra costar con la ayuda del seguro (puede ser ms barato con su seguro), [employer]  sitio web puede darle el precio si no Field seismologist.  - Puede imprimir el cupn correspondiente y llevarlo con su receta a la farmacia.  - Tambin puede pasar por nuestra oficina durante el horario de atencin regular y Charity fundraiser una tarjeta de cupones de GoodRx.  - Si necesita que su receta se enve electrnicamente a una farmacia diferente, informe a nuestra oficina a travs de MyChart de [place] o por telfono llamando al [phone] y presione la opcin 4.

## 2023-06-15 ENCOUNTER — Ambulatory Visit: Payer: 59 | Admitting: Dermatology

## 2023-07-20 ENCOUNTER — Other Ambulatory Visit: Payer: Self-pay | Admitting: Obstetrics and Gynecology

## 2023-07-20 ENCOUNTER — Other Ambulatory Visit: Payer: Self-pay | Admitting: Internal Medicine

## 2023-07-20 DIAGNOSIS — Z1231 Encounter for screening mammogram for malignant neoplasm of breast: Secondary | ICD-10-CM

## 2023-07-27 ENCOUNTER — Ambulatory Visit
Admission: RE | Admit: 2023-07-27 | Discharge: 2023-07-27 | Disposition: A | Discharge status: Home / Self Care | Payer: 59 | Source: Ambulatory Visit | Attending: Obstetrics and Gynecology | Admitting: Obstetrics and Gynecology

## 2023-07-27 DIAGNOSIS — Z1231 Encounter for screening mammogram for malignant neoplasm of breast: Secondary | ICD-10-CM | POA: Insufficient documentation

## 2024-02-06 ENCOUNTER — Ambulatory Visit: Payer: 59 | Admitting: Dermatology

## 2024-02-15 ENCOUNTER — Ambulatory Visit: Payer: 59 | Admitting: Dermatology

## 2024-02-15 ENCOUNTER — Ambulatory Visit (INDEPENDENT_AMBULATORY_CARE_PROVIDER_SITE_OTHER): Payer: 59 | Admitting: Dermatology

## 2024-02-15 ENCOUNTER — Other Ambulatory Visit: Payer: Self-pay | Admitting: Dermatology

## 2024-02-15 DIAGNOSIS — D2271 Melanocytic nevi of right lower limb, including hip: Secondary | ICD-10-CM

## 2024-02-15 DIAGNOSIS — L814 Other melanin hyperpigmentation: Secondary | ICD-10-CM | POA: Diagnosis not present

## 2024-02-15 DIAGNOSIS — D229 Melanocytic nevi, unspecified: Secondary | ICD-10-CM

## 2024-02-15 DIAGNOSIS — W908XXA Exposure to other nonionizing radiation, initial encounter: Secondary | ICD-10-CM

## 2024-02-15 DIAGNOSIS — D2361 Other benign neoplasm of skin of right upper limb, including shoulder: Secondary | ICD-10-CM

## 2024-02-15 DIAGNOSIS — Z86018 Personal history of other benign neoplasm: Secondary | ICD-10-CM

## 2024-02-15 DIAGNOSIS — L719 Rosacea, unspecified: Secondary | ICD-10-CM

## 2024-02-15 DIAGNOSIS — L578 Other skin changes due to chronic exposure to nonionizing radiation: Secondary | ICD-10-CM

## 2024-02-15 DIAGNOSIS — Z1283 Encounter for screening for malignant neoplasm of skin: Secondary | ICD-10-CM | POA: Diagnosis not present

## 2024-02-15 DIAGNOSIS — L853 Xerosis cutis: Secondary | ICD-10-CM

## 2024-02-15 DIAGNOSIS — D1801 Hemangioma of skin and subcutaneous tissue: Secondary | ICD-10-CM

## 2024-02-15 DIAGNOSIS — D239 Other benign neoplasm of skin, unspecified: Secondary | ICD-10-CM

## 2024-02-15 DIAGNOSIS — L821 Other seborrheic keratosis: Secondary | ICD-10-CM

## 2024-02-15 DIAGNOSIS — D2261 Melanocytic nevi of right upper limb, including shoulder: Secondary | ICD-10-CM

## 2024-02-15 DIAGNOSIS — L300 Nummular dermatitis: Secondary | ICD-10-CM

## 2024-02-15 MED ORDER — IVERMECTIN 1 % EX CREA: TOPICAL_CREAM | CUTANEOUS | 5 refills | Status: DC

## 2024-02-15 MED ORDER — DOXYCYCLINE 40 MG PO CPDR: 40.0000 mg | DELAYED_RELEASE_CAPSULE | ORAL | 5 refills | Status: DC

## 2024-02-15 MED ORDER — CICLOPIROX 1 % EX SHAM: MEDICATED_SHAMPOO | CUTANEOUS | 5 refills | Status: AC

## 2024-02-15 NOTE — Patient Instructions (Addendum)
 Doxycycline should be taken with food to prevent nausea. Do not lay down for 30 minutes after taking. Be cautious with sun exposure and use good sun protection while on this medication. Pregnant women should not take this medication.   Rosacea is a chronic progressive skin condition usually affecting the face of adults, causing redness and/or acne bumps. It is treatable but not curable. It sometimes affects the eyes (ocular rosacea) as well. It may respond to topical and/or systemic medication and can flare with stress, sun exposure, alcohol, exercise, topical steroids (including hydrocortisone/cortisone 10) and some foods.  Daily application of broad spectrum spf 30+ sunscreen to face is recommended to reduce flares.  Mole A mole is a colored (pigmented) growth on the skin. Moles are very common. They are usually harmless, but some moles can become cancerous over time. What are the causes? Moles are caused when pigmented skin cells grow together in clusters instead of spreading out in the skin as they normally do. The reason why the skin cells grow together in clusters is not known. What increases the risk? You are more likely to develop a mole if: You have family members who have moles. You are fair skinned. You have red or blond hair. You are often outdoors and exposed to the sun. You received phototherapy when you were a newborn baby. You are female. What are the signs or symptoms? A mole may occur anywhere on your skin. A mole may be: [place] or another color. Although moles are most often brown, they can also be tan, black, red, pink, blue, skin-toned, or colorless. Flat or raised. Smooth or wrinkled. Round in shape. How is this diagnosed? A mole is diagnosed with a skin exam. If your health care provider thinks a mole may be cancerous, all or part of the mole will be removed for testing (biopsy). How is this treated? Most moles are noncancerous (benign) and do not require treatment. If a  mole is found to be cancerous, it will be removed. You may also choose to have a mole removed if it is causing pain or if you do not like the way it looks. Follow these instructions at home: General instructions  Every month, look for new moles and check your existing moles for changes. This is important because a change in a mole can mean that the mole has become cancerous. ABCDE changes in a mole indicate that you should be evaluated by your health care provider. ABCDE stands for: Asymmetry. This means the mole has an irregular shape. It is not round or oval. Border. This means the mole has an irregular or bumpy border. Color. This means the mole has multiple colors in it, including brown, black, blue, red, or tan. Note that it is normal for moles to get darker when a woman is pregnant or takes birth control pills. Diameter. This means the mole is more than 0.2 inches (6 mm) across. Evolving. This refers to any unusual changes or symptoms in the mole, such as pain, itching, stinging, sensitivity, or bleeding. If you have a large number of moles, see a skin doctor (dermatologist) at least one time every year for a full-body skin check. Lifestyle  When you are outdoors, wear sunscreen with SPF 30 (sun protection factor 30) or higher. Use an adequate amount of sunscreen to cover exposed areas of skin. Put it on 30 minutes before you go out. Reapply it every 2 hours or anytime you come out of the water. When you are out  in the sun, wear a broad-brimmed hat and clothing that covers your arms and legs. Wear wraparound sunglasses. Contact a health care provider if: The size, shape, borders, or color of your mole changes. Your mole, or the skin near the mole, becomes painful, sore, red, or swollen. Your mole: Develops more than one color. Itches or bleeds. Becomes scaly, sheds skin, or oozes fluid. Becomes flat or develops raised areas. Becomes hard or soft. You develop a new mole. Summary A  mole is a colored (pigmented) growth on the skin. Moles are very common. They are usually harmless, but some moles can become cancerous over time. Every month, look for new moles and check your existing moles for changes. This is important because a change in a mole can mean that the mole has become cancerous. If you have a large number of moles, see a skin doctor (dermatologist) at least one time every year for a full-body skin check. When you are outdoors, wear sunscreen with SPF 30 (sun protection factor 30) or higher. Reapply it every 2 hours or anytime you come out of the water. Contact a health care provider if you notice changes in a mole or if you develop a new mole. This information is not intended to replace advice given to you by your health care provider. Make sure you discuss any questions you have with your health care provider. Document Revised: 09/03/2021 Document Reviewed: 09/03/2021 Elsevier Patient Education  2024 Elsevier Inc.Gentle Skin Care Guide  1. Bathe no more than once a day.  2. Avoid bathing in hot water  3. Use a mild soap like Dove, Vanicream, Cetaphil, CeraVe. Can use Lever 2000 or Cetaphil antibacterial soap  4. Use soap only where you need it. On most days, use it under your arms, between your legs, and on your feet. Let the water rinse other areas unless visibly dirty.  5. When you get out of the bath/shower, use a towel to gently blot your skin dry, don't rub it.  6. While your skin is still a little damp, apply a moisturizing cream such as Vanicream, CeraVe, Cetaphil, Eucerin, Sarna lotion or plain Vaseline Jelly. For hands apply Neutrogena [place] Hand Cream or Excipial Hand Cream.  7. Reapply moisturizer any time you start to itch or feel dry.  8. Sometimes using free and clear laundry detergents can be helpful. Fabric softener sheets should be avoided. Downy Free & Gentle liquid, or any liquid fabric softener that is free of dyes and perfumes, it  acceptable to use  9. If your doctor has given you prescription creams you may apply moisturizers over them     Due to recent changes in healthcare laws, you may see results of your pathology and/or laboratory studies on MyChart before the doctors have had a chance to review them. We understand that in some cases there may be results that are confusing or concerning to you. Please understand that not all results are received at the same time and often the doctors may need to interpret multiple results in order to provide you with the best plan of care or course of treatment. Therefore, we ask that you please give [place] 2 business days to thoroughly review all your results before contacting the office for clarification. Should we see a critical lab result, you will be contacted sooner.   If You Need Anything After Your Visit  If you have any questions or concerns for your doctor, please call our main line at [phone] and press  option 4 to reach your doctor's medical assistant. If no one answers, please leave a voicemail as directed and we will return your call as soon as possible. Messages left after 4 pm will be answered the following business day.   You may also send [place] a message via MyChart. We typically respond to MyChart messages within 1-2 business days.  For prescription refills, please ask your pharmacy to contact our office. Our fax number is [phone].  If you have an urgent issue when the clinic is closed that cannot wait until the next business day, you can page your doctor at the number below.    Please note that while we do our best to be available for urgent issues outside of office hours, we are not available 24/7.   If you have an urgent issue and are unable to reach [place], you may choose to seek medical care at your doctor's office, retail clinic, urgent care center, or emergency room.  If you have a medical emergency, please immediately call 911 or go to the emergency  department.  Pager Numbers  - Dr. Gwen Pounds: [phone]  - Dr. Roseanne Reno: [phone]  - Dr. Katrinka Blazing: [phone]   In the event of inclement weather, please call our main line at [phone] for an update on the status of any delays or closures.  Dermatology Medication Tips: Please keep the boxes that topical medications come in in order to help keep track of the instructions about where and how to use these. Pharmacies typically print the medication instructions only on the boxes and not directly on the medication tubes.   If your medication is too expensive, please contact our office at [phone] option 4 or send [place] a message through MyChart.   We are unable to tell what your co-pay for medications will be in advance as this is different depending on your insurance coverage. However, we may be able to find a substitute medication at lower cost or fill out paperwork to get insurance to cover a needed medication.   If a prior authorization is required to get your medication covered by your insurance company, please allow [place] 1-2 business days to complete this process.  Drug prices often vary depending on where the prescription is filled and some pharmacies may offer cheaper prices.  The website www.goodrx.com contains coupons for medications through different pharmacies. The prices here do not account for what the cost may be with help from insurance (it may be cheaper with your insurance), but the website can give you the price if you did not use any insurance.  - You can print the associated coupon and take it with your prescription to the pharmacy.  - You may also stop by our office during regular business hours and pick up a GoodRx coupon card.  - If you need your prescription sent electronically to a different pharmacy, notify our office through [facility] or by phone at [phone] option 4.     Si Usted Necesita Algo Despus de Su Visita  Tambin puede enviarnos  un mensaje a travs de Clinical cytogeneticist. Por lo general respondemos a los mensajes de MyChart en el transcurso de 1 a 2 das hbiles.  Para renovar recetas, por favor pida a su farmacia que se ponga en contacto con nuestra oficina. Annie Sable de fax es [place] [phone].  Si tiene un asunto urgente cuando la clnica est cerrada y que no puede esperar hasta el siguiente da hbil, puede llamar/localizar a su doctor(a) al [employer]  que aparece a continuacin.   Por favor, tenga en cuenta que aunque hacemos todo lo posible para estar disponibles para asuntos urgentes fuera del horario de [place], no estamos disponibles las 24 horas del da, los 7 [address]  [address] de la [place].   Si tiene un problema urgente y no puede comunicarse con nosotros, puede optar por buscar atencin mdica  en el consultorio de su doctor(a), en una clnica privada, en un centro de atencin urgente o en una sala de emergencias.  Si tiene Engineer, drilling, por favor llame inmediatamente al 911 o vaya a la sala de emergencias.  Nmeros de bper  - Dr. Gwen Pounds: [phone]  - Dra. Roseanne Reno: [phone]  - Dr. Katrinka Blazing: [phone]   En caso de inclemencias del tiempo, por favor llame a Lacy Duverney principal al [phone] para una actualizacin sobre el [place] de cualquier retraso o cierre.  Consejos para la medicacin en dermatologa: Por favor, guarde las cajas en las que vienen los medicamentos de uso tpico para ayudarle a seguir las instrucciones sobre dnde y cmo usarlos. Las farmacias generalmente imprimen las instrucciones del medicamento slo en las cajas y no directamente en los tubos del [place].   Si su medicamento es muy caro, por favor, pngase en contacto con Rolm Gala llamando al [phone] y presione la opcin 4 o envenos un mensaje a travs de Clinical cytogeneticist.   No podemos decirle cul ser su copago por los medicamentos por adelantado ya que esto es diferente dependiendo de la cobertura de su seguro. Sin  embargo, es posible que podamos encontrar un medicamento sustituto a Audiological scientist un formulario para que el seguro cubra el medicamento que se considera necesario.   Si se requiere una autorizacin previa para que su compaa de seguros [place] su medicamento, por favor permtanos de 1 a 2 das hbiles para completar [address]  Los precios de los medicamentos varan con frecuencia dependiendo del Environmental consultant de dnde se surte la receta y alguna farmacias pueden ofrecer precios ms baratos.  El sitio web www.goodrx.com tiene cupones para medicamentos de Health and safety inspector. Los precios aqu no tienen en cuenta lo que podra costar con la ayuda del seguro (puede ser ms barato con su seguro), pero el sitio web puede darle el precio si no utiliz Tourist information centre manager.  - Puede imprimir el cupn correspondiente y llevarlo con su receta a la farmacia.  - Tambin puede pasar por nuestra oficina durante el horario de atencin regular y Education officer, museum una tarjeta de cupones de GoodRx.  - Si necesita que su receta se enve electrnicamente a una farmacia diferente, informe a nuestra oficina a travs de MyChart de [place] o por telfono llamando al [phone] y presione la opcin 4.

## 2024-02-15 NOTE — Progress Notes (Signed)
 Follow-Up Visit   Subjective  Jessica Thornton is a 58 y.o. female who presents for the following: Skin Cancer Screening and Full Body Skin Exam  The patient presents for Total-Body Skin Exam (TBSE) for skin cancer screening and mole check. The patient has spots, moles and lesions to be evaluated, some may be new or changing and the patient may have concern these could be cancer.   The following portions of the chart were reviewed this encounter and updated as appropriate: medications, allergies, medical history  Review of Systems:  No other skin or systemic complaints except as noted in HPI or Assessment and Plan.  Objective  Well appearing patient in no apparent distress; mood and affect are within normal limits.  A full examination was performed including scalp, head, eyes, ears, nose, lips, neck, chest, axillae, abdomen, back, buttocks, bilateral upper extremities, bilateral lower extremities, hands, feet, fingers, toes, fingernails, and toenails. All findings within normal limits unless otherwise noted below.   Relevant physical exam findings are noted in the Assessment and Plan.   Assessment & Plan   SKIN CANCER SCREENING PERFORMED TODAY.  ACTINIC DAMAGE - Chronic condition, secondary to cumulative UV/sun exposure - diffuse scaly erythematous macules with underlying dyspigmentation - Recommend daily broad spectrum sunscreen SPF 30+ to sun-exposed areas, reapply every 2 hours as needed.  - Staying in the shade or wearing long sleeves, sun glasses (UVA+UVB protection) and wide brim hats (4-inch brim around the entire circumference of the hat) are also recommended for sun protection.  - Call for new or changing lesions.  LENTIGINES, SEBORRHEIC KERATOSES, HEMANGIOMAS - Benign normal skin lesions - Benign-appearing - Call for any changes  MELANOCYTIC NEVI - 0.2 cm med dark brown macule R med inf knee - 0.2 cm med dark brown macule R post upper arm - Tan-brown and/or  pink-flesh-colored symmetric macules and papules - Benign appearing on exam today - Observation - Call clinic for new or changing moles - Recommend daily use of broad spectrum spf 30+ sunscreen to sun-exposed areas.   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  Pt reports getting bumps in scalp as well  Chronic and persistent condition with duration or expected duration over one year. Condition is symptomatic/ bothersome to patient. Not currently at goal.   Rosacea is a chronic progressive skin condition usually affecting the face of adults, causing redness and/or acne bumps. It is treatable but not curable. It sometimes affects the eyes (ocular rosacea) as well. It may respond to topical and/or systemic medication and can flare with stress, sun exposure, alcohol, exercise, topical steroids (including hydrocortisone/cortisone 10) and some foods.  Daily application of broad spectrum spf 30+ sunscreen to face is recommended to reduce flares.  Treatment Plan: Pt has tried and failed Permethrin cream and Metronidazole cream Start Soolantra cream every day. If not covered or too expensive may send to [facility]. Start Ciclopirox shampoo let sit 5 minutes then wash out. Use 2-3d/wk. Start Oracea 40 mg PO QD with food.   Doxycycline should be taken with food to prevent nausea. Do not lay down for 30 minutes after taking. Be cautious  with sun exposure and use good sun protection while on this medication. Pregnant women should not take this medication.    DERMATOFIBROMA - R post upper arm Exam: Firm pink/brown  papulenodule with dimple sign. Treatment Plan: A dermatofibroma is a benign growth possibly related to trauma, such as an insect bite, cut from shaving, or inflamed acne-type bump.  Treatment options to remove include shave or excision with resulting scar and risk of recurrence.  Since benign-appearing and not bothersome, will observe for now.   Nevus Spilus vs Cafe [employer]ait Macule  Exam: 6.0 x 3.0 cm speckled brown patch, no change when compared to photo  Treatment Plan: Benign appearing on exam today. Recommend observation. Call clinic for new or changing moles. Recommend daily use of broad spectrum spf 30+ sunscreen to sun-exposed areas.   Nummular Dermatitis Exam: Pink scaly patches on the lower legs with xerosis  Chronic and persistent condition with duration or expected duration over one year. Condition is symptomatic/ bothersome to patient. Not currently at goal.   Nummular dermatitis (eczema) is a chronic, relapsing, itchy rash that can significantly affect quality of life. It is often associated with dry skin and flares in the wintertime, and may require treatment with prescription topical anti-inflammatory medications, in addition to gentle skin care.  If there is associated atopic dermatitis and topicals are not working, then biologic injections may be necessary to clear rash and control symptoms.  Treatment Plan: Patient deferred Rx treatment at this time. Recommend mild soap and moisturizing cream 1-2 times daily.  Gentle skin care handout provided.    Xerosis - diffuse xerotic patches - recommend gentle, hydrating skin care - gentle skin care handout given  Return in about 1 year (around 02/14/2025) for TBSE.  Maylene Roes, CMA, am acting as scribe for Willeen Niece, MD .   Documentation: I have reviewed the above documentation for accuracy and completeness, and I agree with the above.  Willeen Niece, MD

## 2024-02-16 ENCOUNTER — Other Ambulatory Visit: Payer: Self-pay

## 2024-02-16 MED ORDER — DOXYCYCLINE HYCLATE 20 MG PO TABS: 20.0000 mg | ORAL_TABLET | Freq: Two times a day (BID) | ORAL | 5 refills | Status: AC

## 2024-02-16 MED ORDER — IVERMECTIN 1 % EX CREA: 1.0000 | TOPICAL_CREAM | Freq: Every evening | CUTANEOUS | 5 refills | Status: AC

## 2024-02-16 NOTE — Progress Notes (Unsigned)
 Oracea not covered so Doxycycline 20mg  1 po bid sent in/sh

## 2025-02-26 ENCOUNTER — Encounter: Admitting: Dermatology

## 2025-02-26 ENCOUNTER — Encounter: Payer: 59 | Admitting: Dermatology
